# Patient Record
Sex: Female | Born: 1980 | Race: White | Hispanic: No | Smoking: Former smoker
Health system: Southern US, Community
[De-identification: ages and names within clinical notes are randomized; demographics above are authoritative.]

## PROBLEM LIST (undated history)

## (undated) ENCOUNTER — Ambulatory Visit: Admission: EM | Source: Home / Self Care

## (undated) DIAGNOSIS — F419 Anxiety disorder, unspecified: Secondary | ICD-10-CM

## (undated) DIAGNOSIS — Z9889 Other specified postprocedural states: Secondary | ICD-10-CM

## (undated) DIAGNOSIS — K219 Gastro-esophageal reflux disease without esophagitis: Secondary | ICD-10-CM

## (undated) DIAGNOSIS — G510 Bell's palsy: Secondary | ICD-10-CM

## (undated) DIAGNOSIS — Z8489 Family history of other specified conditions: Secondary | ICD-10-CM

## (undated) DIAGNOSIS — F32A Depression, unspecified: Secondary | ICD-10-CM

---
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
## 2001-06-22 HISTORY — PX: TONSILLECTOMY AND ADENOIDECTOMY: SHX28

## 2004-06-22 HISTORY — PX: BUNIONECTOMY: SHX129

## 2006-06-22 DIAGNOSIS — G510 Bell's palsy: Secondary | ICD-10-CM

## 2006-06-22 DIAGNOSIS — E0590 Thyrotoxicosis, unspecified without thyrotoxic crisis or storm: Secondary | ICD-10-CM

## 2006-06-22 HISTORY — PX: DILATION AND CURETTAGE OF UTERUS: SHX78

## 2006-06-22 HISTORY — DX: Thyrotoxicosis, unspecified without thyrotoxic crisis or storm: E05.90

## 2006-06-22 HISTORY — DX: Bell's palsy: G51.0

## 2006-12-05 ENCOUNTER — Emergency Department: Payer: Self-pay | Admitting: Emergency Medicine

## 2006-12-08 ENCOUNTER — Ambulatory Visit: Payer: Self-pay | Admitting: Obstetrics and Gynecology

## 2007-11-04 ENCOUNTER — Emergency Department: Payer: Self-pay | Admitting: Unknown Physician Specialty

## 2007-11-16 ENCOUNTER — Emergency Department: Payer: Self-pay | Admitting: Emergency Medicine

## 2008-06-09 ENCOUNTER — Observation Stay: Payer: Self-pay | Admitting: Obstetrics and Gynecology

## 2008-06-14 ENCOUNTER — Observation Stay: Payer: Self-pay | Admitting: Unknown Physician Specialty

## 2008-06-14 ENCOUNTER — Inpatient Hospital Stay: Payer: Self-pay | Admitting: Unknown Physician Specialty

## 2009-11-20 ENCOUNTER — Emergency Department: Payer: Self-pay | Admitting: Emergency Medicine

## 2010-09-04 ENCOUNTER — Emergency Department: Payer: Self-pay | Admitting: Emergency Medicine

## 2010-10-14 ENCOUNTER — Emergency Department: Payer: Self-pay | Admitting: Emergency Medicine

## 2010-10-28 ENCOUNTER — Emergency Department: Payer: Self-pay | Admitting: Emergency Medicine

## 2011-05-03 ENCOUNTER — Emergency Department: Payer: Self-pay | Admitting: Emergency Medicine

## 2011-10-12 IMAGING — CT CT ABD-PELV W/ CM
1 of 2 series · 15 of 32 positions shown, 19 images · non-contrast
Comparison: none

REASON FOR EXAM: (1) RUQ/RLQ pain nausea; (2) RUQ/RLQ pain nausea
COMMENTS:

[Series 2: 3mm soft tissue · axial · 0.55mm/px · z∈[-482,-74]mm · 15 of 148 slices shown, 19 images]
[im 6/148  soft-tissue]
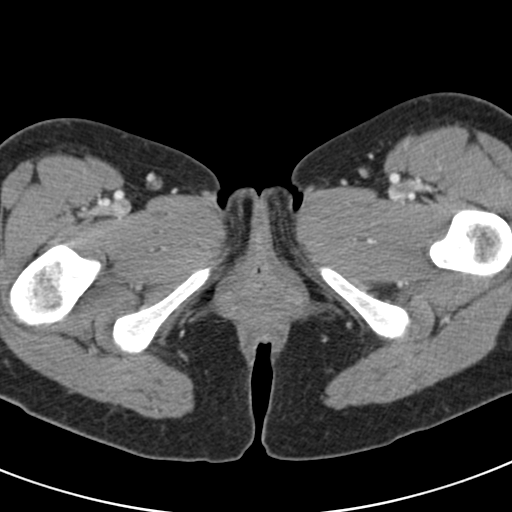
[im 6/148  bone]
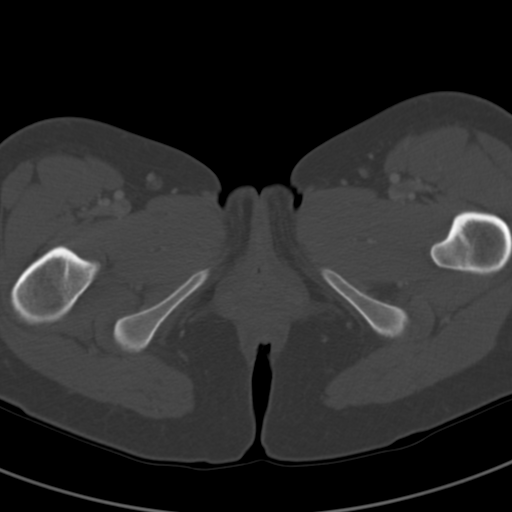
[im 17/148  soft-tissue]
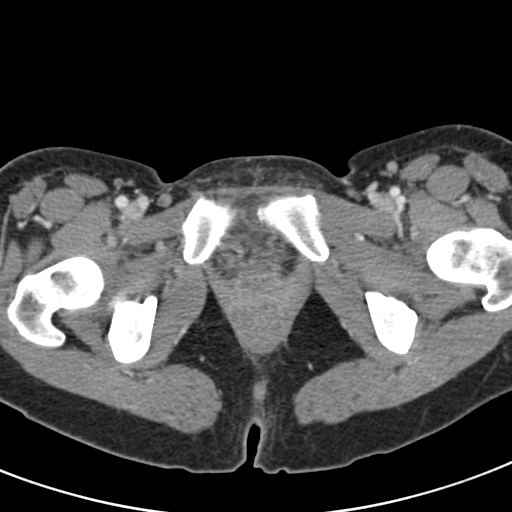
[im 29/148  soft-tissue]
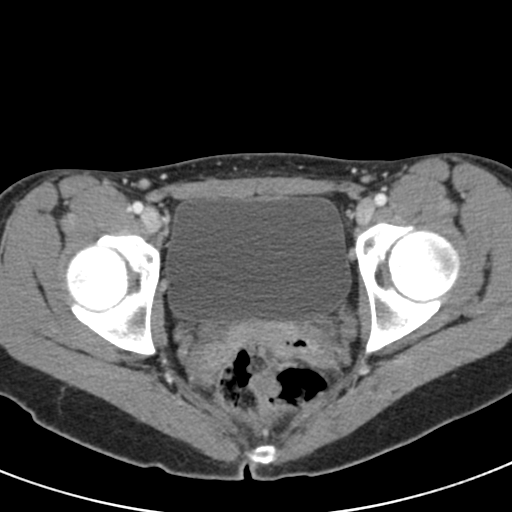
[im 40/148  soft-tissue]
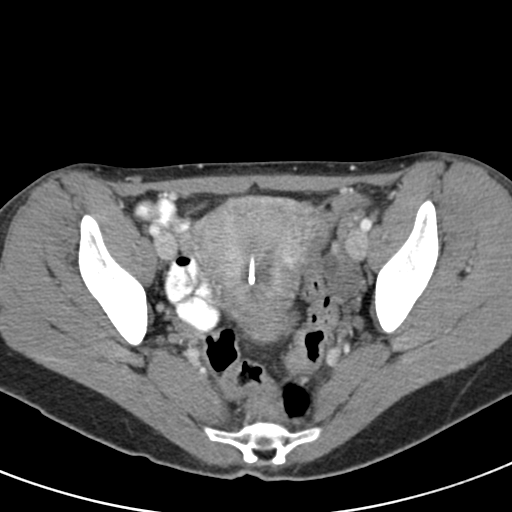
[im 51/148  soft-tissue]
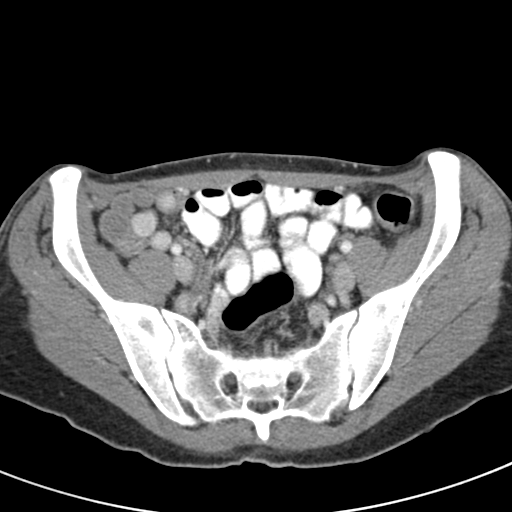
[im 63/148  soft-tissue]
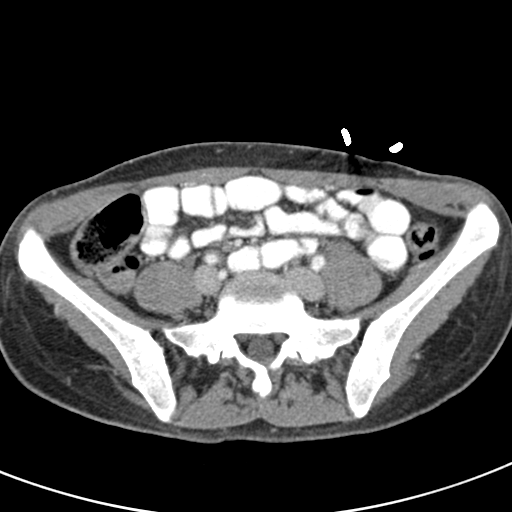
[im 74/148  soft-tissue]
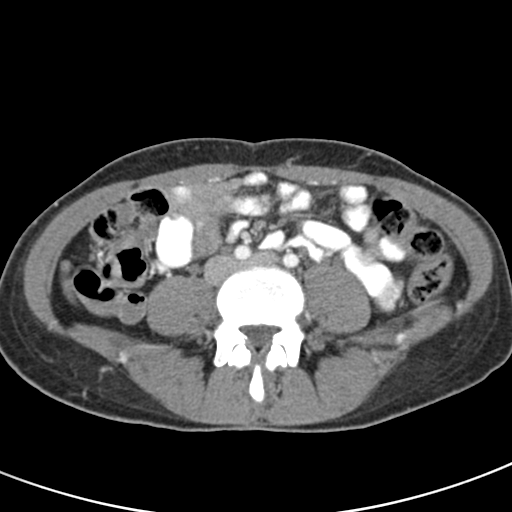
[im 85/148  soft-tissue]
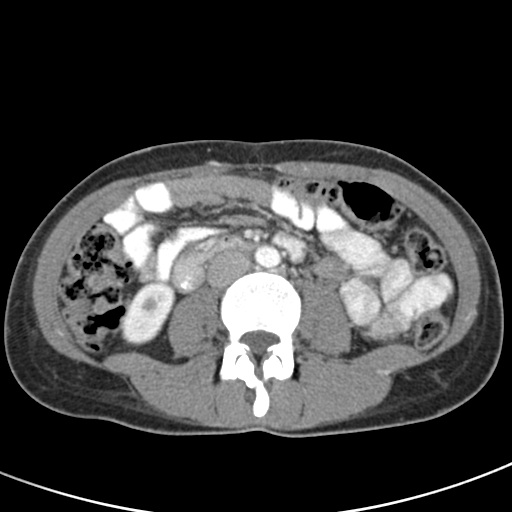
[im 97/148  soft-tissue]
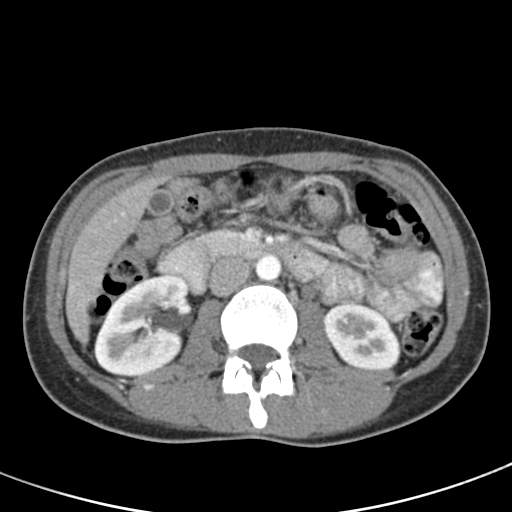
[im 97/148  bone]
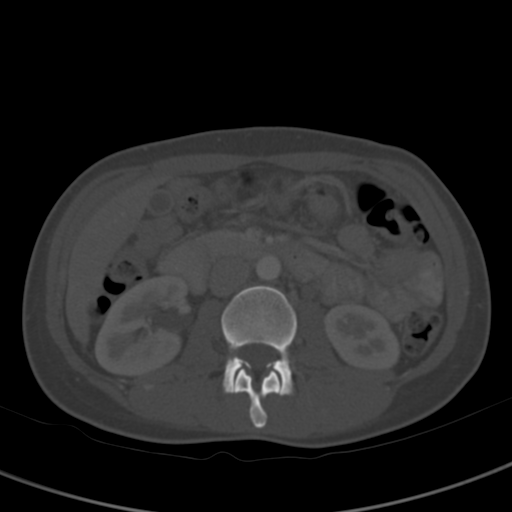
[im 108/148  soft-tissue]
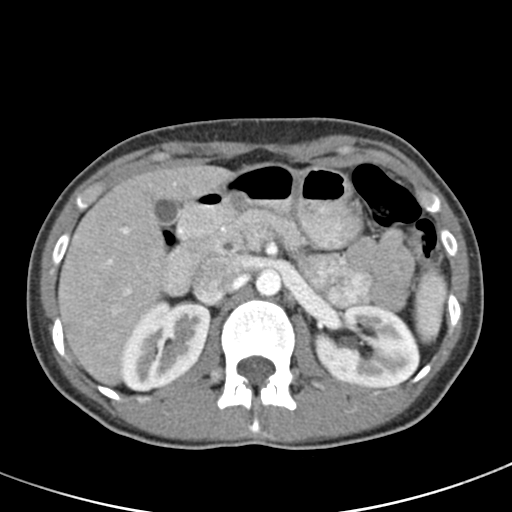
[im 119/148  soft-tissue]
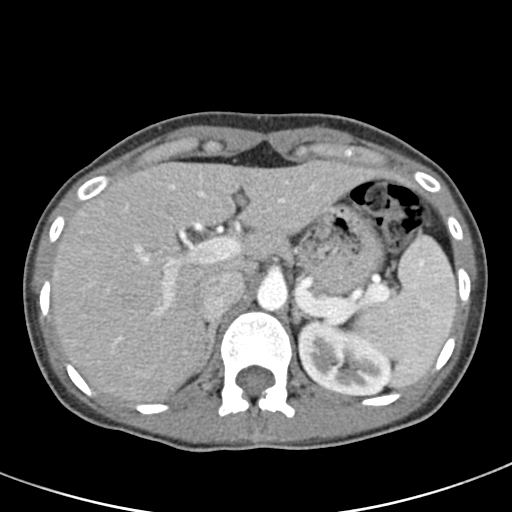
[im 125/148  lung]
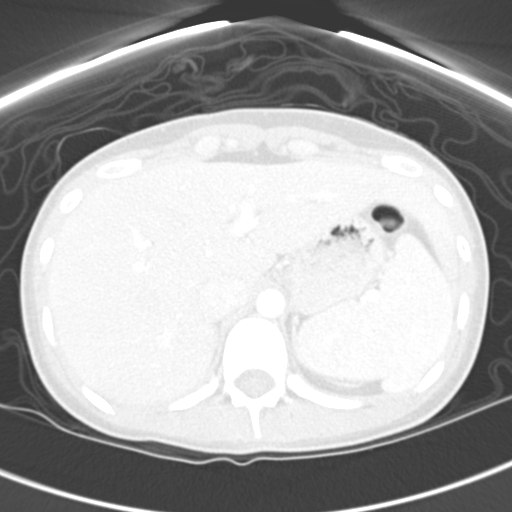
[im 131/148  soft-tissue]
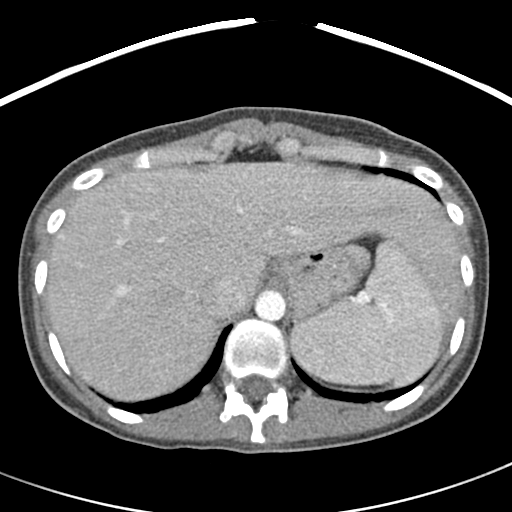
[im 131/148  lung]
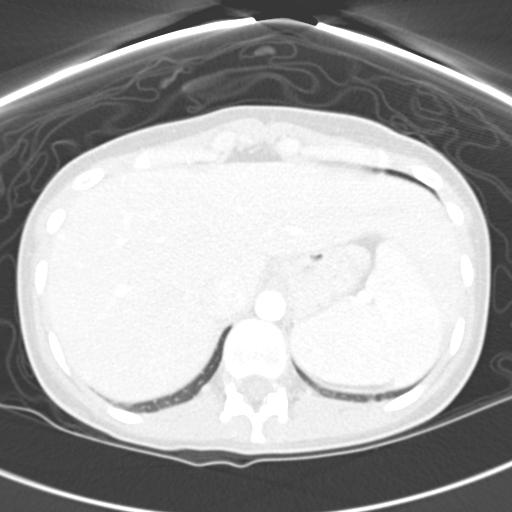
[im 136/148  lung]
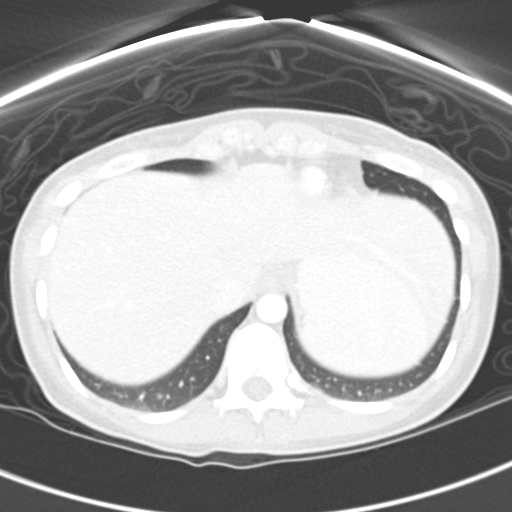
[im 142/148  soft-tissue]
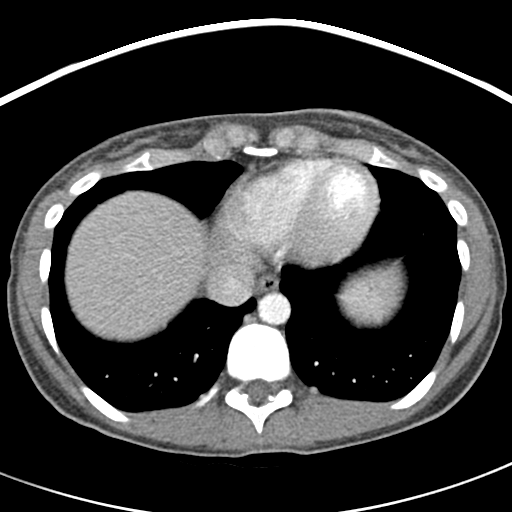
[im 142/148  lung]
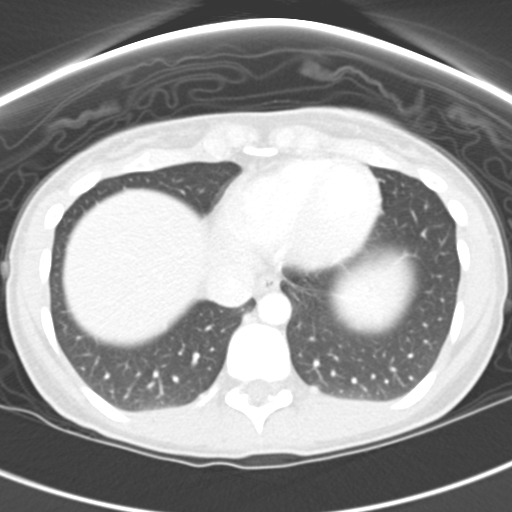

[15 of 32 positions shown; findings below may reference images not displayed]

PROCEDURE:     CT  - CT ABDOMEN / PELVIS  W  - October 15, 2010  [DATE]

RESULT:     Emergent CT of the abdomen and pelvis is performed with 100 mL
of Msovue-DQX iodinated intravenous contrast and oral contrast with images
reconstructed at 3.0 mm slice thickness. There is no previous exam for
comparison.

Images through the base of the lungs demonstrate minimal dependent
atelectasis. The kidneys enhance normally. The liver, spleen, pancreas,
adrenal glands and gallbladder as well as the aorta appear within normal
limits. The appendix is normal. An intrauterine device is present. The
urinary bladder and uterus are otherwise unremarkable. There is no
adenopathy or adnexal mass. There is no ascites or evidence of
pneumoperitoneum. No inflammatory stranding is seen. No abnormal bowel
distention or bowel wall thickening is evident.
IMPRESSION: 1.     Unremarkable CT of the abdomen and pelvis.
2.     Intrauterine device present.

## 2011-12-18 ENCOUNTER — Emergency Department: Payer: Self-pay | Admitting: Emergency Medicine

## 2011-12-18 LAB — URINALYSIS, COMPLETE
Bilirubin,UR: NEGATIVE
Glucose,UR: NEGATIVE mg/dL (ref 0–75)
Ketone: NEGATIVE
Protein: 100
Squamous Epithelial: 15
WBC UR: 1273 /HPF (ref 0–5)

## 2011-12-18 LAB — CBC WITH DIFFERENTIAL/PLATELET
Basophil #: 0.1 10*3/uL (ref 0.0–0.1)
HGB: 13.8 g/dL (ref 12.0–16.0)
Lymphocyte #: 2.7 10*3/uL (ref 1.0–3.6)
Lymphocyte %: 31 %
MCHC: 33.5 g/dL (ref 32.0–36.0)
Monocyte #: 0.6 x10 3/mm (ref 0.2–0.9)
Monocyte %: 6.9 %
Platelet: 196 10*3/uL (ref 150–440)
RBC: 4.58 10*6/uL (ref 3.80–5.20)
WBC: 8.7 10*3/uL (ref 3.6–11.0)

## 2011-12-18 LAB — COMPREHENSIVE METABOLIC PANEL
Albumin: 4.6 g/dL (ref 3.4–5.0)
Alkaline Phosphatase: 78 U/L (ref 50–136)
BUN: 19 mg/dL — ABNORMAL HIGH (ref 7–18)
Creatinine: 0.91 mg/dL (ref 0.60–1.30)
Glucose: 73 mg/dL (ref 65–99)
SGOT(AST): 18 U/L (ref 15–37)
Sodium: 138 mmol/L (ref 136–145)

## 2011-12-18 LAB — PREGNANCY, URINE: Pregnancy Test, Urine: NEGATIVE m[IU]/mL

## 2011-12-20 LAB — URINE CULTURE

## 2014-05-22 ENCOUNTER — Emergency Department: Payer: Self-pay | Admitting: Internal Medicine

## 2014-05-22 LAB — URINALYSIS, COMPLETE
Specific Gravity: 1.01 (ref 1.003–1.030)
Squamous Epithelial: 4

## 2014-05-22 LAB — CBC WITH DIFFERENTIAL/PLATELET
BASOS PCT: 0.4 %
Basophil #: 0 10*3/uL (ref 0.0–0.1)
EOS ABS: 0.2 10*3/uL (ref 0.0–0.7)
Eosinophil %: 1.8 %
HCT: 42.2 % (ref 35.0–47.0)
HGB: 13.9 g/dL (ref 12.0–16.0)
Lymphocyte #: 1.8 10*3/uL (ref 1.0–3.6)
Lymphocyte %: 14.4 %
MCH: 30.1 pg (ref 26.0–34.0)
MCHC: 33 g/dL (ref 32.0–36.0)
MCV: 91 fL (ref 80–100)
MONOS PCT: 6.2 %
Monocyte #: 0.8 x10 3/mm (ref 0.2–0.9)
NEUTROS PCT: 77.2 %
Neutrophil #: 9.7 10*3/uL — ABNORMAL HIGH (ref 1.4–6.5)
Platelet: 242 10*3/uL (ref 150–440)
RBC: 4.63 10*6/uL (ref 3.80–5.20)
RDW: 13 % (ref 11.5–14.5)
WBC: 12.5 10*3/uL — ABNORMAL HIGH (ref 3.6–11.0)

## 2014-05-22 LAB — COMPREHENSIVE METABOLIC PANEL
ALBUMIN: 4.1 g/dL (ref 3.4–5.0)
ALT: 15 U/L
ANION GAP: 5 — AB (ref 7–16)
AST: 21 U/L (ref 15–37)
Alkaline Phosphatase: 58 U/L
BUN: 8 mg/dL (ref 7–18)
Bilirubin,Total: 0.5 mg/dL (ref 0.2–1.0)
CALCIUM: 8.7 mg/dL (ref 8.5–10.1)
CHLORIDE: 102 mmol/L (ref 98–107)
CREATININE: 0.78 mg/dL (ref 0.60–1.30)
Co2: 32 mmol/L (ref 21–32)
EGFR (Non-African Amer.): >60
Glucose: 97 mg/dL (ref 65–99)
Osmolality: 276 (ref 275–301)
POTASSIUM: 3.5 mmol/L (ref 3.5–5.1)
SODIUM: 139 mmol/L (ref 136–145)
Total Protein: 7.6 g/dL (ref 6.4–8.2)

## 2014-05-24 LAB — URINE CULTURE

## 2018-04-05 ENCOUNTER — Other Ambulatory Visit: Payer: Self-pay

## 2018-04-05 ENCOUNTER — Emergency Department
Admission: EM | Admit: 2018-04-05 | Discharge: 2018-04-05 | Disposition: A | Discharge status: Home / Self Care | Payer: Self-pay | Attending: Emergency Medicine | Admitting: Emergency Medicine

## 2018-04-05 ENCOUNTER — Encounter: Payer: Self-pay | Admitting: Emergency Medicine

## 2018-04-05 DIAGNOSIS — R519 Headache, unspecified: Secondary | ICD-10-CM

## 2018-04-05 DIAGNOSIS — K047 Periapical abscess without sinus: Secondary | ICD-10-CM | POA: Insufficient documentation

## 2018-04-05 DIAGNOSIS — R51 Headache: Secondary | ICD-10-CM | POA: Insufficient documentation

## 2018-04-05 HISTORY — DX: Bell's palsy: G51.0

## 2018-04-05 LAB — CBC
HCT: 43.5 % (ref 36.0–46.0)
HEMOGLOBIN: 14.4 g/dL (ref 12.0–15.0)
MCH: 31.4 pg (ref 26.0–34.0)
MCHC: 33.1 g/dL (ref 30.0–36.0)
MCV: 95 fL (ref 80.0–100.0)
Platelets: 266 10*3/uL (ref 150–400)
RBC: 4.58 MIL/uL (ref 3.87–5.11)
RDW: 12.1 % (ref 11.5–15.5)
WBC: 8.1 10*3/uL (ref 4.0–10.5)
nRBC: 0 % (ref 0.0–0.2)

## 2018-04-05 LAB — BASIC METABOLIC PANEL
Anion gap: 10 (ref 5–15)
BUN: 17 mg/dL (ref 6–20)
CO2: 28 mmol/L (ref 22–32)
Calcium: 9.1 mg/dL (ref 8.9–10.3)
Chloride: 102 mmol/L (ref 98–111)
Creatinine, Ser: 0.71 mg/dL (ref 0.44–1.00)
GFR calc Af Amer: >60 mL/min (ref >60)
GLUCOSE: 102 mg/dL — AB (ref 70–99)
POTASSIUM: 3.2 mmol/L — AB (ref 3.5–5.1)
SODIUM: 140 mmol/L (ref 135–145)

## 2018-04-05 MED ORDER — PENICILLIN V POTASSIUM 500 MG PO TABS: 500.0000 mg | ORAL_TABLET | Freq: Four times a day (QID) | ORAL | 0 refills | Status: AC

## 2018-04-05 MED ORDER — PENICILLIN V POTASSIUM 250 MG PO TABS
500.0000 mg | ORAL_TABLET | Freq: Once | ORAL | Status: AC
  Administered 2018-04-05: 500 mg via ORAL
  Filled 2018-04-05: qty 2

## 2018-04-05 MED ORDER — ONDANSETRON 4 MG PO TBDP: 4.0000 mg | ORAL_TABLET | Freq: Three times a day (TID) | ORAL | 0 refills | Status: DC | PRN

## 2018-04-05 MED ORDER — KETOROLAC TROMETHAMINE 10 MG PO TABS
10.0000 mg | ORAL_TABLET | Freq: Once | ORAL | Status: AC
  Administered 2018-04-05: 10 mg via ORAL
  Filled 2018-04-05: qty 1

## 2018-04-05 NOTE — ED Triage Notes (Addendum)
Pt to ED via POV with c/o RT sided facial numbness sicne 0430 this am. PT c/o tenderness to side as well. VSS, speech clear. Hx of bell palsy

## 2018-04-05 NOTE — ED Provider Notes (Signed)
[facility] Emergency Department Provider Note  ____________________________________________  Time seen: Approximately 7:24 PM  I have reviewed the triage vital signs and the nursing notes.   HISTORY  Chief Complaint Numbness    HPI Sarah Summers is a 37 y.o. female with a history of left-sided Bell's palsy remotely who complains of right facial pain and swelling since this morning.  Gradual onset, worsening.  Denies fever chills vision change headache neck stiffness or vomiting.  No recent trauma.  No trouble breathing or swallowing.  3 days ago she chipped a tooth while eating chips during the night out.      Past Medical History:  Diagnosis Date  . Bell palsy      There are no active problems to display for this patient.    History reviewed. No pertinent surgical history.   Prior to Admission medications   Medication Sig Start Date End Date Taking? Authorizing Provider  ondansetron (ZOFRAN ODT) 4 MG disintegrating tablet Take 1 tablet (4 mg total) by mouth every 8 (eight) hours as needed for nausea or vomiting. 04/05/18   Sharman Cheek, MD  penicillin v potassium (VEETID) 500 MG tablet Take 1 tablet (500 mg total) by mouth 4 (four) times daily for 7 days. 04/05/18 04/12/18  Sharman Cheek, MD     Allergies Patient has no known allergies.   No family history on file.  Social History Social History   Tobacco Use  . Smoking status: Never Smoker  . Smokeless tobacco: Never Used  Substance Use Topics  . Alcohol use: Not Currently  . Drug use: Not Currently    Review of Systems  Constitutional:   No fever or chills.  ENT:   No sore throat. No rhinorrhea.  Right facial pain as above Cardiovascular:   No chest pain or syncope. Respiratory:   No dyspnea or cough. Gastrointestinal:   Negative for abdominal pain, vomiting and diarrhea.  Musculoskeletal:   Negative for focal pain or swelling All other systems reviewed and are  negative except as documented above in ROS and HPI.  ____________________________________________   PHYSICAL EXAM:  VITAL SIGNS: ED Triage Vitals  Enc Vitals Group     BP 04/05/18 1418 131/81     Pulse Rate 04/05/18 1418 90     Resp 04/05/18 1418 16     Temp 04/05/18 1418 98.4 F (36.9 C)     Temp Source 04/05/18 1418 Oral     SpO2 04/05/18 1418 99 %     Weight 04/05/18 1419 125 lb (56.7 kg)     Height 04/05/18 1419 5\' 4"  (1.626 m)     Head Circumference --      Peak Flow --      Pain Score 04/05/18 1419 6     Pain Loc --      Pain Edu? --      Excl. in GC? --     Vital signs reviewed, nursing assessments reviewed.   Constitutional:   Alert and oriented. Non-toxic appearance. Eyes:   Conjunctivae are normal. EOMI. PERRL. ENT      Head:   Normocephalic and atraumatic.  Slight swelling of the right maxillary face without erythema induration streaking or fluctuance.  Not significantly tender.      Nose:   No congestion/rhinnorhea.  Normal      Mouth/Throat:   MMM, poor dentition with multiple decayed teeth.  Right upper premolar is decayed and fractured with a small amount of purulent discharge from the  tooth stump.  No gingival swelling.  Uvula midline.  Floor of mouth soft.  No pharyngeal erythema. No peritonsillar mass.       Neck:   No meningismus. Full ROM. Hematological/Lymphatic/Immunilogical:   No cervical or occipital or periauricular lymphadenopathy. Cardiovascular:   RRR. Symmetric bilateral radial and DP pulses.  No murmurs. Cap refill less than 2 seconds. Respiratory:   Normal respiratory effort without tachypnea/retractions. Breath sounds are clear and equal bilaterally. No wheezes/rales/rhonchi. Gastrointestinal:   Soft and nontender. Non distended. There is no CVA tenderness.  No rebound, rigidity, or guarding. Musculoskeletal:   Normal range of motion in all extremities. Neurologic:   Normal speech and language.  Motor grossly intact. No acute focal  neurologic deficits are appreciated.   ____________________________________________    LABS (pertinent positives/negatives) (all labs ordered are listed, but only abnormal results are displayed) Labs Reviewed  BASIC METABOLIC PANEL - Abnormal; Notable for the following components:      Result Value   Potassium 3.2 (*)    Glucose, Bld 102 (*)    All other components within normal limits  CBC  URINALYSIS, COMPLETE (UACMP) WITH MICROSCOPIC  CBG MONITORING, ED  POC URINE PREG, ED   ____________________________________________   EKG    ____________________________________________    RADIOLOGY  No results found.  ____________________________________________   PROCEDURES Procedures  ____________________________________________    CLINICAL IMPRESSION / ASSESSMENT AND PLAN / ED COURSE  Pertinent labs & imaging results that were available during my care of the patient were reviewed by me and considered in my medical decision making (see chart for details).    Patient presents with odontogenic abscess in the right upper jaw with some evidence of secondary space infection although vital signs are normal, there is no frank erythema or cellulitis of the face.  Appears to be limited to the tooth without gingival abscess.  I will start her on penicillin, Zofran, NSAIDs.  Follow-up with dentistry within 1 week.      ____________________________________________   FINAL CLINICAL IMPRESSION(S) / ED DIAGNOSES    Final diagnoses:  Abscessed tooth  Right facial pain     ED Discharge Orders         Ordered    ondansetron (ZOFRAN ODT) 4 MG disintegrating tablet  Every 8 hours PRN     04/05/18 1923    penicillin v potassium (VEETID) 500 MG tablet  4 times daily     04/05/18 1923          Portions of this note were generated with dragon dictation software. Dictation errors may occur despite best attempts at proofreading.    Sharman Cheek, MD 04/05/18 [phone]

## 2018-04-05 NOTE — Discharge Instructions (Addendum)
Take ibuprofen 600mg  and tylenol 650mg  every 4-6 hours for pain control. Follow up with dentistry in 3-7 days.

## 2019-04-14 ENCOUNTER — Other Ambulatory Visit: Payer: Self-pay

## 2019-04-14 DIAGNOSIS — Z20822 Contact with and (suspected) exposure to covid-19: Secondary | ICD-10-CM

## 2019-04-16 LAB — NOVEL CORONAVIRUS, NAA: SARS-CoV-2, NAA: NOT DETECTED

## 2021-08-06 ENCOUNTER — Other Ambulatory Visit: Payer: Self-pay

## 2021-08-06 ENCOUNTER — Encounter
Admission: RE | Admit: 2021-08-06 | Discharge: 2021-08-06 | Disposition: A | Discharge status: Home / Self Care | Payer: Medicaid Other | Source: Ambulatory Visit | Attending: Obstetrics and Gynecology | Admitting: Obstetrics and Gynecology

## 2021-08-06 VITALS — Ht 67.0 in | Wt 137.0 lb

## 2021-08-06 DIAGNOSIS — Z01812 Encounter for preprocedural laboratory examination: Secondary | ICD-10-CM

## 2021-08-06 HISTORY — DX: Anxiety disorder, unspecified: F41.9

## 2021-08-06 HISTORY — DX: Family history of other specified conditions: Z84.89

## 2021-08-06 HISTORY — DX: Depression, unspecified: F32.A

## 2021-08-06 HISTORY — DX: Gastro-esophageal reflux disease without esophagitis: K21.9

## 2021-08-06 MED ORDER — LACTATED RINGERS IV SOLN: INTRAVENOUS | Status: DC

## 2021-08-06 MED ORDER — CHLORHEXIDINE GLUCONATE 0.12 % MT SOLN: 15.0000 mL | Freq: Once | OROMUCOSAL | Status: AC

## 2021-08-06 MED ORDER — POVIDONE-IODINE 10 % EX SWAB: 2.0000 "application " | Freq: Once | CUTANEOUS | Status: DC

## 2021-08-06 MED ORDER — GABAPENTIN 300 MG PO CAPS: 300.0000 mg | ORAL_CAPSULE | ORAL | Status: AC

## 2021-08-06 MED ORDER — DOXYCYCLINE HYCLATE 100 MG IV SOLR
200.0000 mg | INTRAVENOUS | Status: AC
  Administered 2021-08-07: 200 mg via INTRAVENOUS
  Filled 2021-08-06: qty 200

## 2021-08-06 MED ORDER — ACETAMINOPHEN 500 MG PO TABS: 1000.0000 mg | ORAL_TABLET | ORAL | Status: AC

## 2021-08-06 MED ORDER — ORAL CARE MOUTH RINSE
15.0000 mL | Freq: Once | OROMUCOSAL | Status: AC
Start: 2021-08-06 — End: 2021-08-07

## 2021-08-06 NOTE — H&P (Signed)
Scheduled for Suction D+C Sarah Summers is a 41 y.o. female here for follow up sono .G4P2 . Had a prior D+C 12+[redacted] weeks EGA  Pt here for u/s for Threatened AB .  BT A+  U/s  Done :  Single nonviable gestation     Crl= 4.74 cm= 11.4 wks   Fetal  Abnormal  Abd seen    Probable omphalocele    No fetal heart motion seen     Lt ov not seen Rt ov simple cyst=2.16 cm     Cx lg=5.57 cm   Pt states she is interested in permanent sterilization ( medicaid )    Past Medical History:  has a past medical history of Anxiety (2001), Depression, GERD (gastroesophageal reflux disease), and History of abnormal cervical Pap smear (2001).  Past Surgical History:  has a past surgical history that includes Fracture surgery and Tonsillectomy. Family History: family history includes Breast cancer in her maternal grandmother; Colon cancer in her paternal grandmother; Mental illness in her paternal grandmother. Social History:  reports that she has been smoking cigarettes. She started smoking about 10 years ago. She has a 27.00 pack-year smoking history. She has never used smokeless tobacco. She reports that she does not drink alcohol and does not use drugs. OB/GYN History:  OB History     Gravida  4   Para  2   Term  2   Preterm      AB  1   Living  2      SAB  1   IAB      Ectopic      Molar      Multiple      Live Births  2          Allergies: has No Known Allergies. Medications:  Current Outpatient Medications:    pediatric multivitamin no.76 (FLINTSTONES COMPLETE) Chew, Take 2 tablets by mouth, Disp: , Rfl:    sertraline (ZOLOFT) 100 MG tablet, Take 0.5 tablets (50 mg total) by mouth once daily TAKE ONE-HALF TABLET BY MOUTH DAILY, Disp: 45 tablet, Rfl: 0   albuterol 90 mcg/actuation inhaler, Inhale 2 inhalations into the lungs every 6 (six) hours as needed for Wheezing for up to 30 days (Patient not taking: Reported on 06/30/2021), Disp: 1 Inhaler, Rfl: 0    cholecalciferol (VITAMIN D3) 2,000 unit tablet, Take 2 tablets (4,000 Units total) by mouth once daily (Patient not taking: Reported on 06/30/2021), Disp: 30 tablet, Rfl: 11   DM/p-ephed/acetaminoph/doxylam (VICKS NYQUIL LIQUICAPS ORAL), Take by mouth (Patient not taking: Reported on 08/06/2021), Disp: , Rfl:    promethazine (PHENERGAN) 25 MG tablet, Take 1 tablet (25 mg total) by mouth every 6 (six) hours as needed for Nausea (Patient not taking: Reported on 08/06/2021), Disp: 30 tablet, Rfl: 1   traZODone (DESYREL) 50 MG tablet, ANYWHERE FROM 1/2 TAB TO 3 WHOLE TABS AT BEDTIME FOR SLEEP. (Patient not taking: Reported on 08/06/2021), Disp: 90 tablet, Rfl: 3  Review of Systems: General:   No fatigue or weight loss Eyes:   No vision changes Ears:   No hearing difficulty Respiratory:                No cough or shortness of breath Pulmonary:   No asthma or shortness of breath Cardiovascular:        No chest pain, palpitations, dyspnea on exertion Gastrointestinal:          No abdominal bloating, chronic diarrhea, constipations, masses, pain or hematochezia  Genitourinary:  No hematuria, dysuria, abnormal vaginal discharge, pelvic pain, Menometrorrhagia Lymphatic:  No swollen lymph nodes Musculoskeletal: No muscle weakness Neurologic:  No extremity weakness, syncope, seizure disorder Psychiatric:  No history of depression, delusions or suicidal/homicidal ideation    Exam:   Vitals:   08/06/21 1154  BP: 114/77  Pulse: 90    Body mass index is 21.46 kg/m.  WDWN white/ female in NAD   Lungs: CTA  CV : RRR without murmur    Neck:  no thyromegaly Abdomen: soft , no mass, normal active bowel sounds,  non-tender, no rebound tenderness Pelvic: tanner stage 5 ,  External genitalia: vulva /labia no lesions Urethra: no prolapse Vagina: normal physiologic d/c Cervix: no lesions, no cervical motion tenderness , closed   Uterus: normal size shape and contour, non-tender Adnexa: no mass,   non-tender   Rectovaginal:  Impression:   The primary encounter diagnosis was Missed ab. A diagnosis of Fetal omphalocele in pregnancy, antepartum, fetus 1 was also pertinent to this visit.  Given pt does not want additional children I will not do genetic testing on the products of conception   Plan:   Offered cytotec vaginal ( Home tx ) vs Suction D+C ( my recommendation )  Scheduled for D+C in am .  Benefits and risks to surgery: The proposed benefit of the surgery has been discussed with the patient. The possible risks include, but are not limited to: organ injury to the bowel , bladder, ureters, and major blood vessels and nerves. There is a possibility of additional surgeries resulting from these injuries. There is also the risk of blood transfusion and the need to receive blood products during or after the procedure which may rarely lead to HIV or Hepatitis C infection. There is a risk of developing a deep venous thrombosis or a pulmonary embolism . There is the possibility of wound infection and also anesthetic complications, even the rare possibility of death. The patient understands these risks and wishes to proceed. All questions have been answered and the consent has been signed.  H+P completed today  65 minutes in pt care    No follow-ups on file.  Caroline Sauger, MD

## 2021-08-06 NOTE — Patient Instructions (Addendum)
Your procedure is scheduled on: Thursday, February 16 Report to the Registration Desk on the 1st floor of the [facility] 7:30 am To find out your arrival time, please call [phone] between 1PM - 3PM on: Wednesday, February 15  REMEMBER: Instructions that are not followed completely may result in serious medical risk, up to and including death; or upon the discretion of your surgeon and anesthesiologist your surgery may need to be rescheduled.  Do not eat food after midnight the night before surgery.  No gum chewing, lozengers or hard candies.  You may however, drink CLEAR liquids up to 2 hours before you are scheduled to arrive for your surgery. Do not drink anything within 2 hours of your scheduled arrival time.  Clear liquids include: - water  - apple juice without pulp - gatorade (not RED, PURPLE, OR BLUE) - black coffee or tea (Do NOT add milk or creamers to the coffee or tea) Do NOT drink anything that is not on this list.  TAKE THESE MEDICATIONS THE MORNING OF SURGERY WITH A SIP OF WATER:  Omeprazole (Prilosec) - (take one the night before and one on the morning of surgery - helps to prevent nausea after surgery.) Sertraline (Zoloft)  One week prior to surgery: Stop Anti-inflammatories (NSAIDS) such as Advil, Aleve, Ibuprofen, Motrin, Naproxen, Naprosyn and Aspirin based products such as Excedrin, Goodys Powder, BC Powder. Stop ANY OVER THE COUNTER supplements until after surgery. You may however, continue to take Tylenol if needed for pain up until the day of surgery.  No Alcohol for 24 hours before or after surgery.  No Smoking including e-cigarettes for 24 hours prior to surgery.  No chewable tobacco products for at least 6 hours prior to surgery.  No nicotine patches on the day of surgery.  Do not use any "recreational" drugs for at least a week prior to your surgery.  Please be advised that the combination of cocaine and anesthesia may have negative outcomes,  up to and including death. If you test positive for cocaine, your surgery will be cancelled.  On the morning of surgery brush your teeth with toothpaste and water, you may rinse your mouth with mouthwash if you wish. Do not swallow any toothpaste or mouthwash.  Do not wear jewelry, make-up, hairpins, clips or nail polish.  Do not wear lotions, powders, or perfumes.   Do not shave body from the neck down 48 hours prior to surgery just in case you cut yourself which could leave a site for infection.   Contact lenses, hearing aids and dentures may not be worn into surgery.  Do not bring valuables to the hospital. [facility] is not responsible for any missing/lost belongings or valuables.   Notify your doctor if there is any change in your medical condition (cold, fever, infection).  Wear comfortable clothing (specific to your surgery type) to the hospital.  After surgery, you can help prevent lung complications by doing breathing exercises.  Take deep breaths and cough every 1-2 hours. Your doctor may order a device called an Incentive Spirometer to help you take deep breaths.  If you are being discharged the day of surgery, you will not be allowed to drive home. You will need a responsible adult (18 years or older) to drive you home and stay with you that night.   If you are taking public transportation, you will need to have a responsible adult (18 years or older) with you. Please confirm with your physician that it is  acceptable to use public transportation.   Please call the Pre-admissions Testing Dept. at [phone] if you have any questions about these instructions.  Surgery Visitation Policy:  Patients undergoing a surgery or procedure may have one family member or support person with them as long as that person is not COVID-19 positive or experiencing its symptoms.  That person may remain in the waiting area during the procedure and may rotate out with other people.

## 2021-08-07 ENCOUNTER — Ambulatory Visit: Payer: Medicaid Other | Admitting: Anesthesiology

## 2021-08-07 ENCOUNTER — Other Ambulatory Visit: Payer: Self-pay

## 2021-08-07 ENCOUNTER — Ambulatory Visit
Admission: RE | Admit: 2021-08-07 | Discharge: 2021-08-07 | Disposition: A | Discharge status: Home / Self Care | Payer: Medicaid Other | Attending: Obstetrics and Gynecology | Admitting: Obstetrics and Gynecology

## 2021-08-07 ENCOUNTER — Encounter: Payer: Self-pay | Admitting: Obstetrics and Gynecology

## 2021-08-07 ENCOUNTER — Encounter
Admission: RE | Disposition: A | Discharge status: Home / Self Care | Payer: Self-pay | Source: Home / Self Care | Attending: Obstetrics and Gynecology

## 2021-08-07 DIAGNOSIS — F32A Depression, unspecified: Secondary | ICD-10-CM | POA: Insufficient documentation

## 2021-08-07 DIAGNOSIS — O35FXX0 Maternal care for other (suspected) fetal abnormality and damage, fetal musculoskeletal anomalies of trunk, not applicable or unspecified: Secondary | ICD-10-CM | POA: Insufficient documentation

## 2021-08-07 DIAGNOSIS — F419 Anxiety disorder, unspecified: Secondary | ICD-10-CM | POA: Insufficient documentation

## 2021-08-07 DIAGNOSIS — Z01818 Encounter for other preprocedural examination: Secondary | ICD-10-CM

## 2021-08-07 DIAGNOSIS — O021 Missed abortion: Secondary | ICD-10-CM | POA: Diagnosis present

## 2021-08-07 HISTORY — PX: DILATION AND EVACUATION: SHX1459

## 2021-08-07 LAB — BASIC METABOLIC PANEL
Anion gap: 6 (ref 5–15)
BUN: 9 mg/dL (ref 6–20)
CO2: 26 mmol/L (ref 22–32)
Calcium: 9 mg/dL (ref 8.9–10.3)
Chloride: 103 mmol/L (ref 98–111)
Creatinine, Ser: 0.45 mg/dL (ref 0.44–1.00)
GFR, Estimated: >60 mL/min (ref >60)
Glucose, Bld: 106 mg/dL — ABNORMAL HIGH (ref 70–99)
Potassium: 3.8 mmol/L (ref 3.5–5.1)
Sodium: 135 mmol/L (ref 135–145)

## 2021-08-07 LAB — CBC
HCT: 35.4 % — ABNORMAL LOW (ref 36.0–46.0)
Hemoglobin: 12.3 g/dL (ref 12.0–15.0)
MCH: 30.7 pg (ref 26.0–34.0)
MCHC: 34.7 g/dL (ref 30.0–36.0)
MCV: 88.3 fL (ref 80.0–100.0)
Platelets: 202 10*3/uL (ref 150–400)
RBC: 4.01 MIL/uL (ref 3.87–5.11)
RDW: 12.4 % (ref 11.5–15.5)
WBC: 5.5 10*3/uL (ref 4.0–10.5)
nRBC: 0 % (ref 0.0–0.2)

## 2021-08-07 LAB — TYPE AND SCREEN
ABO/RH(D): A POS
Antibody Screen: NEGATIVE

## 2021-08-07 LAB — ABO/RH: ABO/RH(D): A POS

## 2021-08-07 SURGERY — DILATION AND EVACUATION, UTERUS
Anesthesia: General | Site: Uterus

## 2021-08-07 MED ORDER — SILVER NITRATE-POT NITRATE 75-25 % EX MISC
CUTANEOUS | Status: DC | PRN
  Administered 2021-08-07: 1

## 2021-08-07 MED ORDER — FENTANYL CITRATE (PF) 100 MCG/2ML IJ SOLN
25.0000 ug | INTRAMUSCULAR | Status: DC | PRN
  Administered 2021-08-07: 25 ug via INTRAVENOUS

## 2021-08-07 MED ORDER — DEXAMETHASONE SODIUM PHOSPHATE 10 MG/ML IJ SOLN
INTRAMUSCULAR | Status: DC | PRN
  Administered 2021-08-07: 10 mg via INTRAVENOUS

## 2021-08-07 MED ORDER — METHYLERGONOVINE MALEATE 0.2 MG/ML IJ SOLN
INTRAMUSCULAR | Status: AC
  Filled 2021-08-07: qty 1

## 2021-08-07 MED ORDER — KETOROLAC TROMETHAMINE 30 MG/ML IJ SOLN
INTRAMUSCULAR | Status: DC | PRN
  Administered 2021-08-07: 30 mg via INTRAVENOUS

## 2021-08-07 MED ORDER — LIDOCAINE HCL (CARDIAC) PF 100 MG/5ML IV SOSY
PREFILLED_SYRINGE | INTRAVENOUS | Status: DC | PRN
Start: 2021-08-07 — End: 2021-08-07
  Administered 2021-08-07: 50 mg via INTRAVENOUS

## 2021-08-07 MED ORDER — DEXAMETHASONE SODIUM PHOSPHATE 10 MG/ML IJ SOLN
INTRAMUSCULAR | Status: AC
  Filled 2021-08-07: qty 1

## 2021-08-07 MED ORDER — DEXMEDETOMIDINE (PRECEDEX) IN NS 20 MCG/5ML (4 MCG/ML) IV SYRINGE
PREFILLED_SYRINGE | INTRAVENOUS | Status: AC
  Filled 2021-08-07: qty 5

## 2021-08-07 MED ORDER — PROPOFOL 500 MG/50ML IV EMUL
INTRAVENOUS | Status: AC
  Filled 2021-08-07: qty 50

## 2021-08-07 MED ORDER — FENTANYL CITRATE (PF) 100 MCG/2ML IJ SOLN
INTRAMUSCULAR | Status: AC
  Administered 2021-08-07: 25 ug via INTRAVENOUS
  Filled 2021-08-07: qty 2

## 2021-08-07 MED ORDER — LIDOCAINE HCL (PF) 2 % IJ SOLN
INTRAMUSCULAR | Status: AC
  Filled 2021-08-07: qty 5

## 2021-08-07 MED ORDER — ACETAMINOPHEN 500 MG PO TABS
ORAL_TABLET | ORAL | Status: AC
  Administered 2021-08-07: 1000 mg via ORAL
  Filled 2021-08-07: qty 2

## 2021-08-07 MED ORDER — SILVER NITRATE-POT NITRATE 75-25 % EX MISC
CUTANEOUS | Status: AC
  Filled 2021-08-07: qty 10

## 2021-08-07 MED ORDER — ONDANSETRON HCL 4 MG/2ML IJ SOLN
INTRAMUSCULAR | Status: AC
  Filled 2021-08-07: qty 2

## 2021-08-07 MED ORDER — PROPOFOL 10 MG/ML IV BOLUS
INTRAVENOUS | Status: DC | PRN
  Administered 2021-08-07: 150 mg via INTRAVENOUS

## 2021-08-07 MED ORDER — GABAPENTIN 300 MG PO CAPS
ORAL_CAPSULE | ORAL | Status: AC
  Administered 2021-08-07: 300 mg via ORAL
  Filled 2021-08-07: qty 1

## 2021-08-07 MED ORDER — EPHEDRINE SULFATE (PRESSORS) 50 MG/ML IJ SOLN
INTRAMUSCULAR | Status: DC | PRN
  Administered 2021-08-07: 5 mg via INTRAVENOUS

## 2021-08-07 MED ORDER — FENTANYL CITRATE (PF) 100 MCG/2ML IJ SOLN
INTRAMUSCULAR | Status: DC | PRN
  Administered 2021-08-07: 25 ug via INTRAVENOUS
  Administered 2021-08-07: 50 ug via INTRAVENOUS
  Administered 2021-08-07: 25 ug via INTRAVENOUS

## 2021-08-07 MED ORDER — FENTANYL CITRATE (PF) 100 MCG/2ML IJ SOLN
INTRAMUSCULAR | Status: AC
  Filled 2021-08-07: qty 2

## 2021-08-07 MED ORDER — MIDAZOLAM HCL 2 MG/2ML IJ SOLN
INTRAMUSCULAR | Status: DC | PRN
Start: 2021-08-07 — End: 2021-08-07
  Administered 2021-08-07: 2 mg via INTRAVENOUS

## 2021-08-07 MED ORDER — ONDANSETRON HCL 4 MG/2ML IJ SOLN: 4.0000 mg | Freq: Once | INTRAMUSCULAR | Status: DC | PRN

## 2021-08-07 MED ORDER — ONDANSETRON HCL 4 MG/2ML IJ SOLN
INTRAMUSCULAR | Status: DC | PRN
  Administered 2021-08-07: 4 mg via INTRAVENOUS

## 2021-08-07 MED ORDER — DEXMEDETOMIDINE (PRECEDEX) IN NS 20 MCG/5ML (4 MCG/ML) IV SYRINGE
PREFILLED_SYRINGE | INTRAVENOUS | Status: DC | PRN
  Administered 2021-08-07 (×2): 8 ug via INTRAVENOUS
  Administered 2021-08-07: 4 ug via INTRAVENOUS

## 2021-08-07 MED ORDER — CHLORHEXIDINE GLUCONATE 0.12 % MT SOLN
OROMUCOSAL | Status: AC
  Administered 2021-08-07: 15 mL via OROMUCOSAL
  Filled 2021-08-07: qty 15

## 2021-08-07 MED ORDER — EPHEDRINE 5 MG/ML INJ
INTRAVENOUS | Status: AC
  Filled 2021-08-07: qty 5

## 2021-08-07 MED ORDER — KETOROLAC TROMETHAMINE 30 MG/ML IJ SOLN
INTRAMUSCULAR | Status: AC
  Filled 2021-08-07: qty 1

## 2021-08-07 MED ORDER — 0.9 % SODIUM CHLORIDE (POUR BTL) OPTIME
TOPICAL | Status: DC | PRN
  Administered 2021-08-07: 50 mL

## 2021-08-07 MED ORDER — MIDAZOLAM HCL 2 MG/2ML IJ SOLN
INTRAMUSCULAR | Status: AC
  Filled 2021-08-07: qty 2

## 2021-08-07 SURGICAL SUPPLY — 31 items
BACTOSHIELD CHG 4% 4OZ (MISCELLANEOUS) ×1
DRSG TELFA 3X8 NADH (GAUZE/BANDAGES/DRESSINGS) IMPLANT
FILTER UTR ASPR ASSEMBLY (MISCELLANEOUS) ×2 IMPLANT
FILTER UTR ASPR SPEC (MISCELLANEOUS) ×1 IMPLANT
FLTR UTR ASPR SPEC (MISCELLANEOUS) ×2
GAUZE 4X4 16PLY ~~LOC~~+RFID DBL (SPONGE) ×2 IMPLANT
GLOVE SURG SYN 8.0 (GLOVE) ×2 IMPLANT
GLOVE SURG SYN 8.0 PF PI (GLOVE) ×1 IMPLANT
GOWN STRL REUS W/ TWL LRG LVL3 (GOWN DISPOSABLE) ×1 IMPLANT
GOWN STRL REUS W/ TWL XL LVL3 (GOWN DISPOSABLE) ×1 IMPLANT
GOWN STRL REUS W/TWL LRG LVL3 (GOWN DISPOSABLE) ×2
GOWN STRL REUS W/TWL XL LVL3 (GOWN DISPOSABLE) ×2
KIT BERKELEY 1ST TRIMESTER 3/8 (MISCELLANEOUS) ×2 IMPLANT
KIT TURNOVER CYSTO (KITS) ×2 IMPLANT
MANIFOLD NEPTUNE II (INSTRUMENTS) ×2 IMPLANT
NS IRRIG 500ML POUR BTL (IV SOLUTION) ×1 IMPLANT
PACK DNC HYST (MISCELLANEOUS) ×2 IMPLANT
PAD DRESSING TELFA 3X8 NADH (GAUZE/BANDAGES/DRESSINGS) IMPLANT
PAD OB MATERNITY 4.3X12.25 (PERSONAL CARE ITEMS) ×2 IMPLANT
PAD PREP 24X41 OB/GYN DISP (PERSONAL CARE ITEMS) ×2 IMPLANT
SCRUB CHG 4% DYNA-HEX 4OZ (MISCELLANEOUS) ×1 IMPLANT
SET BERKELEY SUCTION TUBING (SUCTIONS) ×2 IMPLANT
SET CYSTO W/LG BORE CLAMP LF (SET/KITS/TRAYS/PACK) IMPLANT
TOWEL OR 17X26 4PK STRL BLUE (TOWEL DISPOSABLE) ×2 IMPLANT
TRAP TISSUE FILTER (MISCELLANEOUS) ×2 IMPLANT
VACURETTE 10 RIGID CVD (CANNULA) ×2 IMPLANT
VACURETTE 6 ASPIR F TIP BERK (CANNULA) ×1 IMPLANT
VACURETTE 7MM F TIP (CANNULA)
VACURETTE 7MM F TIP STRL (CANNULA) ×1 IMPLANT
VACURETTE 8 RIGID CVD (CANNULA) ×1 IMPLANT
VACURETTE 8MM F TIP (MISCELLANEOUS) ×2 IMPLANT

## 2021-08-07 NOTE — Brief Op Note (Signed)
08/07/2021  8:10 AM  PATIENT:  Sarah Summers  41 y.o. female  PRE-OPERATIVE DIAGNOSIS:  missed abortion  POST-OPERATIVE DIAGNOSIS:  missed abortion  PROCEDURE:  Procedure(s): SUCTION D&C (N/A)  SURGEON:  Surgeon(s) and Role:    * Odessa Morren, Ihor Austin, MD - Primary  PHYSICIAN ASSISTANT:   ASSISTANTS: none   ANESTHESIA:   general  EBL:  100 mL IOF 500c  Uo 50 cc  BLOOD ADMINISTERED:none  DRAINS: none   LOCAL MEDICATIONS USED:  NONE  SPECIMEN:  Source of Specimen:  POC  DISPOSITION OF SPECIMEN:  PATHOLOGY  COUNTS:  YES  TOURNIQUET:  * No tourniquets in log *  DICTATION: .Other Dictation: Dictation Number verbal  PLAN OF CARE: Discharge to home after PACU  PATIENT DISPOSITION:  PACU - hemodynamically stable.   Delay start of Pharmacological VTE agent (>24hrs) due to surgical blood loss or risk of bleeding: not applicable

## 2021-08-07 NOTE — Transfer of Care (Signed)
Immediate Anesthesia Transfer of Care Note  Patient: Sarah Summers  Procedure(s) Performed: SUCTION D&C (Uterus)  Patient Location: PACU  Anesthesia Type:General  Level of Consciousness: awake and drowsy  Airway & Oxygen Therapy: Patient Spontanous Breathing  Post-op Assessment: Report given to RN and Post -op Vital signs reviewed and stable  Post vital signs: Reviewed and stable  Last Vitals:  Vitals Value Taken Time  BP 96/52 08/07/21 0817  Temp 36.7 C 08/07/21 0816  Pulse 74 08/07/21 0822  Resp 15 08/07/21 0822  SpO2 100 % 08/07/21 0822  Vitals shown include unvalidated device data.  Last Pain:  Vitals:   08/07/21 0816  TempSrc:   PainSc: Asleep         Complications: No notable events documented.

## 2021-08-07 NOTE — Progress Notes (Signed)
Pt is ready for Suction D+C  Labs reviewed . Proceed

## 2021-08-07 NOTE — Anesthesia Preprocedure Evaluation (Signed)
Anesthesia Evaluation  Patient identified by MRN, date of birth, ID band Patient awake    Reviewed: Allergy & Precautions, NPO status , Patient's Chart, lab work & pertinent test results  Airway Mallampati: II  TM Distance: >3 FB Neck ROM: Full    Dental  (+) Teeth Intact   Pulmonary neg pulmonary ROS, Patient abstained from smoking., former smoker,    Pulmonary exam normal breath sounds clear to auscultation       Cardiovascular negative cardio ROS Normal cardiovascular exam Rhythm:Regular Rate:Normal     Neuro/Psych Anxiety Depression negative neurological ROS  negative psych ROS   GI/Hepatic negative GI ROS, Neg liver ROS, GERD  ,  Endo/Other  negative endocrine ROS  Renal/GU Renal disease  negative genitourinary   Musculoskeletal negative musculoskeletal ROS (+)   Abdominal Normal abdominal exam  (+)   Peds negative pediatric ROS (+)  Hematology negative hematology ROS (+)   Anesthesia Other Findings Past Medical History: No date: Anxiety 2008: Bell palsy No date: Depression No date: Family history of adverse reaction to anesthesia     Comment:  mother has N/V No date: GERD (gastroesophageal reflux disease) 2008: Hyperthyroidism  Past Surgical History: 2006: BUNIONECTOMY; Right 2008: DILATION AND CURETTAGE OF UTERUS 2003: TONSILLECTOMY AND ADENOIDECTOMY  BMI    Body Mass Index: 21.44 kg/m      Reproductive/Obstetrics negative OB ROS                             Anesthesia Physical Anesthesia Plan  ASA: 2  Anesthesia Plan: General   Post-op Pain Management:    Induction: Intravenous  PONV Risk Score and Plan: Dexamethasone, Ondansetron, Midazolam and Treatment may vary due to age or medical condition  Airway Management Planned: LMA  Additional Equipment:   Intra-op Plan:   Post-operative Plan: Extubation in OR  Informed Consent: I have reviewed the patients  History and Physical, chart, labs and discussed the procedure including the risks, benefits and alternatives for the proposed anesthesia with the patient or authorized representative who has indicated his/her understanding and acceptance.     Dental Advisory Given  Plan Discussed with: CRNA and Surgeon  Anesthesia Plan Comments:         Anesthesia Quick Evaluation

## 2021-08-07 NOTE — Discharge Instructions (Signed)

## 2021-08-07 NOTE — Anesthesia Procedure Notes (Signed)
Procedure Name: LMA Insertion Date/Time: 08/07/2021 7:41 AM Performed by: Reece Agar, CRNA Pre-anesthesia Checklist: Patient identified, Emergency Drugs available, Suction available and Patient being monitored Patient Re-evaluated:Patient Re-evaluated prior to induction Oxygen Delivery Method: Circle system utilized Preoxygenation: Pre-oxygenation with 100% oxygen Induction Type: IV induction Ventilation: Mask ventilation without difficulty LMA: LMA inserted LMA Size: 4.0 Number of attempts: 1 Placement Confirmation: positive ETCO2 and breath sounds checked- equal and bilateral Tube secured with: Tape Dental Injury: Teeth and Oropharynx as per pre-operative assessment

## 2021-08-07 NOTE — Op Note (Signed)
NAME: Sarah Summers, Sarah Summers MEDICAL RECORD NO: [id number] ACCOUNT NO: [id number] DATE OF BIRTH: 01-Aug-1980 FACILITY: [facility] LOCATION: [facility] PHYSICIAN: Suzy Bouchard, MD  Operative Report   DATE OF PROCEDURE: 08/07/2021  PREOPERATIVE DIAGNOSIS:  Missed abortion.  POSTOPERATIVE DIAGNOSIS:  Missed abortion.  PROCEDURE:  Suction dilation and curettage.  SURGEON:  Jennell Corner, MD  ANESTHESIA:  General endotracheal anesthesia.  INDICATIONS:  A 41 year old gravida 4, para 2 patient at 12 weeks 13 days when she presented to an ultrasound at [facility].  This showed fetal size 11 weeks 4 days, with no fetal heart motion.  The patient's blood type A positive.  DESCRIPTION OF PROCEDURE:  After adequate general endotracheal anesthesia, the patient was placed in dorsal supine position with legs in the candy cane stirrups.  The patient's abdomen, perineum and vagina were prepped and draped in normal sterile  fashion.  The patient did receive 200 mg intravenous doxycycline prior to commencement of the case.  Timeout was performed.  Straight catheterization of the bladder yielded 50 mL clear urine.  Weighted speculum was placed in the posterior vaginal vault  and the anterior cervix was grasped with a single tooth tenaculum.  Cervix was dilated to #20 Hanks dilator without difficulty.  The Hegar dilators were used as well.  A #8 flexible suction curette was placed into the endometrial cavity and a large  amount of tissue consistent with products of conception were removed.  Randall stone forceps were used with teasing out of additional tissue.  A #10 rigid suction curette was placed into the endometrial cavity with no additional tissue.  Sharp curettage  was performed with good uterine cry throughout and repeat flexible suction curette with no additional tissue.  Good hemostasis noted.  Silver nitrate was used on the tenaculum sites.  The patient tolerated the procedure  well.  ESTIMATED BLOOD LOSS:  100 mL.  INTRAOPERATIVE FLUIDS:  500 mL.  URINE OUTPUT:  50 mL. There were no complications.  The patient was taken to recovery room in good condition.   SHW D: 08/07/2021 8:18:35 am T: 08/07/2021 9:26:00 am  JOB: [id number]/ [id number]

## 2021-08-07 NOTE — Progress Notes (Signed)
°   08/07/21 0700  Clinical Encounter Type  Visited With Patient  Visit Type Initial;Pre-op  Spiritual Encounters  Spiritual Needs Prayer   Chaplain provided pre-op support through compassionate presence, meaningful conversation, and prayer.

## 2021-08-08 ENCOUNTER — Encounter: Payer: Self-pay | Admitting: Obstetrics and Gynecology

## 2021-08-08 LAB — SURGICAL PATHOLOGY

## 2021-08-20 NOTE — Anesthesia Postprocedure Evaluation (Signed)
Anesthesia Post Note ? ?Patient: Alvina A Snuffer ? ?Procedure(s) Performed: SUCTION D&C (Uterus) ? ?Patient location during evaluation: PACU ?Anesthesia Type: General ?Level of consciousness: awake and awake and alert ?Pain management: satisfactory to patient ?Vital Signs Assessment: post-procedure vital signs reviewed and stable ?Respiratory status: spontaneous breathing and respiratory function stable ?Cardiovascular status: blood pressure returned to baseline ?Anesthetic complications: no ? ? ?No notable events documented. ? ? ?Last Vitals:  ?Vitals:  ? 08/07/21 0853 08/07/21 0902  ?BP: (!) 101/59 91/60  ?Pulse:  68  ?Resp: 17 20  ?Temp:  (!) 36.1 ?C  ?SpO2: 99% 94%  ?  ?Last Pain:  ?Vitals:  ? 08/08/21 0910  ?TempSrc:   ?PainSc: 6   ? ? ?  ?  ?  ?  ?  ?  ? ?VAN STAVEREN,Maykel Reitter ? ? ? ? ?

## 2021-09-17 NOTE — H&P (Signed)
Sarah Summers is a 41 y.o. female here for L/S BTL  ?. ?  ?Pt is here interested in discussion of permanent sterilization . She is s/p D+C for Missed Ab.  ?  ?  ?Past Medical History:  has a past medical history of Anxiety (2001), Depression, GERD (gastroesophageal reflux disease), and History of abnormal cervical Pap smear (2001).  ?Past Surgical History:  has a past surgical history that includes Fracture surgery; Tonsillectomy; and Dilation and curettage, diagnostic / therapeutic (08/07/2021). ?Family History: family history includes Breast cancer in her maternal grandmother; Colon cancer in her paternal grandmother; Mental illness in her paternal grandmother. ?Social History:  reports that she has been smoking cigarettes. She started smoking about 11 years ago. She has a 27.00 pack-year smoking history. She has never used smokeless tobacco. She reports that she does not drink alcohol and does not use drugs. ?OB/GYN History:  ?        ?OB History   ?  Gravida  ?4  ? Para  ?2  ? Term  ?2  ? Preterm  ?   ? AB  ?2  ? Living  ?2  ?  ?  SAB  ?1  ? IAB  ?   ? Ectopic  ?   ? Molar  ?   ? Multiple  ?   ? Live Births  ?2  ?  ?  ?   ?  ?  ?Allergies: has No Known Allergies. ?Medications: ?  ?Current Outpatient Medications:  ?  sertraline (ZOLOFT) 100 MG tablet, Take 0.5 tablets (50 mg total) by mouth once daily TAKE ONE-HALF TABLET BY MOUTH DAILY, Disp: 45 tablet, Rfl: 0 ?  traZODone (DESYREL) 50 MG tablet, ANYWHERE FROM 1/2 TAB TO 3 WHOLE TABS AT BEDTIME FOR SLEEP., Disp: 90 tablet, Rfl: 3 ?  albuterol 90 mcg/actuation inhaler, Inhale 2 inhalations into the lungs every 6 (six) hours as needed for Wheezing for up to 30 days (Patient not taking: Reported on 06/30/2021), Disp: 1 Inhaler, Rfl: 0 ?  cholecalciferol (VITAMIN D3) 2,000 unit tablet, Take 2 tablets (4,000 Units total) by mouth once daily (Patient not taking: Reported on 06/30/2021), Disp: 30 tablet, Rfl: 11 ?  DM/p-ephed/acetaminoph/doxylam (VICKS NYQUIL LIQUICAPS  ORAL), Take by mouth (Patient not taking: Reported on 08/06/2021), Disp: , Rfl:  ?  HYDROcodone-acetaminophen (NORCO) 5-325 mg tablet, Take 1 tablet by mouth every 8 (eight) hours as needed for Pain POST OP 1-2 TABLETS EVERY 4 -6 HOURS PRN (Patient not taking: Reported on 08/26/2021), Disp: 5 tablet, Rfl: 0 ?  ibuprofen (MOTRIN) 800 MG tablet, Take 1 tablet (800 mg total) by mouth every 8 (eight) hours as needed for Pain (Patient not taking: Reported on 08/26/2021), Disp: 30 tablet, Rfl: 1 ?  pediatric multivitamin no.76 (FLINTSTONES COMPLETE) Chew, Take 2 tablets by mouth, Disp: , Rfl:  ?  promethazine (PHENERGAN) 25 MG tablet, Take 1 tablet (25 mg total) by mouth every 6 (six) hours as needed for Nausea (Patient not taking: Reported on 08/06/2021), Disp: 30 tablet, Rfl: 1 ?  ?Review of Systems: ?General:                      No fatigue or weight loss ?Eyes:                           No vision changes ?Ears:  No hearing difficulty ?Respiratory:                No cough or shortness of breath ?Pulmonary:                  No asthma or shortness of breath ?Cardiovascular:           No chest pain, palpitations, dyspnea on exertion ?Gastrointestinal:          No abdominal bloating, chronic diarrhea, constipations, masses, pain or hematochezia ?Genitourinary:             No hematuria, dysuria, abnormal vaginal discharge, pelvic pain, Menometrorrhagia ?Lymphatic:                   No swollen lymph nodes ?Musculoskeletal:         No muscle weakness ?Neurologic:                  No extremity weakness, syncope, seizure disorder ?Psychiatric:                  No history of depression, delusions or suicidal/homicidal ideation ?  ?  ? Exam:  ?  ?   ?Vitals:  ?  09/19/21  1111  ?BP: 105/67  ?Pulse: 80  ?  ?  ?Body mass index is 22.71 kg/m?. ?  ?WDWN white/  female in NAD   ?Lungs: CTA  ?CV : RRR without murmur   ?Abdomen: soft , no mass, normal active bowel sounds,  non-tender, no rebound tendernessPelvic:  tanner stage 5 ,  ?External genitalia: vulva /labia no lesions ?Urethra: no prolapse ?Vagina: normal physiologic d/c ?Cervix: no lesions, no cervical motion tenderness   ?Uterus: normal size shape and contour, non-tender ?Adnexa: no mass,  non-tender   ?Rectovaginal: ?  ?Impression:  ?  ?The encounter diagnosis was Consultation for female sterilization. ?  ?  ?  ?Plan:  ? l/s BTL - falope rings  ?I have counseled her on the procedure and the risks involved .  ?Failure rate 1: 300  ?Benefits and risks to surgery: ?The proposed benefit of the surgery has been discussed with the patient. ?The possible risks include, but are not limited to: organ injury to the bowel , bladder, ureters, and major blood vessels and nerves. There is a possibility of additional surgeries resulting from these injuries. There is also the risk of blood transfusion and the need to receive blood products during or after the procedure which may rarely lead to HIV or Hepatitis C infection. There is a risk of developing a deep venous thrombosis or a pulmonary embolism . There is the possibility of wound infection and also anesthetic complications, even the rare possibility of death. The patient understands these risks and wishes to proceed. All questions have been answered and the consent has been signed. ?  ?  ?Medicaid consent signed today  ?  ?No follow-ups on file. ?  ?Vilma Prader, MD ?

## 2021-09-24 ENCOUNTER — Other Ambulatory Visit: Payer: Self-pay

## 2021-09-24 ENCOUNTER — Encounter
Admission: RE | Admit: 2021-09-24 | Discharge: 2021-09-24 | Disposition: A | Discharge status: Home / Self Care | Payer: Medicaid Other | Source: Ambulatory Visit | Attending: Obstetrics and Gynecology | Admitting: Obstetrics and Gynecology

## 2021-09-24 HISTORY — DX: Other specified postprocedural states: Z98.890

## 2021-09-24 NOTE — Patient Instructions (Addendum)
Your procedure is scheduled on: Friday 10/03/21 ?Report to the Registration Desk on the 1st floor of the [facility]. ?To find out your arrival time, please call [phone] between 1PM - 3PM on: Thursday 10/02/21 ? ?REMEMBER: ?Instructions that are not followed completely may result in serious medical risk, up to and including death; or upon the discretion of your surgeon and anesthesiologist your surgery may need to be rescheduled. ? ?Do not eat food after midnight the night before surgery.  ?No gum chewing, lozengers or hard candies. ? ?You may however, drink CLEAR liquids up to 2 hours before you are scheduled to arrive for your surgery. Do not drink anything within 2 hours of your scheduled arrival time. ? ?Clear liquids include: ?- water  ?- apple juice without pulp ?- gatorade (not RED colors) ?- black coffee or tea (Do NOT add milk or creamers to the coffee or tea) ?Do NOT drink anything that is not on this list. ? ?TAKE THESE MEDICATIONS THE MORNING OF SURGERY WITH A SIP OF WATER: ?sertraline (ZOLOFT) 100 MG tablet ? ?omeprazole (PRILOSEC) 20 MG capsule (take one the night before and one on the morning of surgery - helps to prevent nausea after surgery.) ? ?One week prior to surgery: ?Stop Anti-inflammatories (NSAIDS) such as Advil, Aleve, Ibuprofen, Motrin, Naproxen, Naprosyn and Aspirin based products such as Excedrin, Goodys Powder, BC Powder. ? ?Stop ANY OVER THE COUNTER supplements until after surgery. ? ?You may however, continue to take Tylenol if needed for pain up until the day of surgery. ? ?No Alcohol for 24 hours before or after surgery. ? ?No Smoking including e-cigarettes for 24 hours prior to surgery.  ?No chewable tobacco products for at least 6 hours prior to surgery.  ?No nicotine patches on the day of surgery. ? ?Do not use any "recreational" drugs for at least a week prior to your surgery.  ?Please be advised that the combination of cocaine and anesthesia may have negative outcomes, up  to and including death. ?If you test positive for cocaine, your surgery will be cancelled. ? ?On the morning of surgery brush your teeth with toothpaste and water, you may rinse your mouth with mouthwash if you wish. ?Do not swallow any toothpaste or mouthwash. ? ?Do not wear jewelry, make-up, hairpins, clips or nail polish. ? ?Do not wear lotions, powders, or perfumes.  ? ?Do not shave body from the neck down 48 hours prior to surgery just in case you cut yourself which could leave a site for infection.  ?Also, freshly shaved skin may become irritated if using the CHG soap. ? ?Do not bring valuables to the hospital. [facility] is not responsible for any missing/lost belongings or valuables.  ? ?Notify your doctor if there is any change in your medical condition (cold, fever, infection). ? ?Wear comfortable clothing (specific to your surgery type) to the hospital. ? ?After surgery, you can help prevent lung complications by doing breathing exercises.  ?Take deep breaths and cough every 1-2 hours. Your doctor may order a device called an Incentive Spirometer to help you take deep breaths. ? ?If you are being discharged the day of surgery, you will not be allowed to drive home. ?You will need a responsible adult (18 years or older) to drive you home and stay with you that night.  ? ?If you are taking public transportation, you will need to have a responsible adult (18 years or older) with you. ?Please confirm with your physician that it is  acceptable to use public transportation.  ? ?Please call the Pre-admissions Testing Dept. at [phone] if you have any questions about these instructions. ? ?Surgery Visitation Policy: ? ?Patients undergoing a surgery or procedure may have two family members or support persons with them as long as the person is not COVID-19 positive or experiencing its symptoms.  ? ?Inpatient Visitation:   ? ?Visiting hours are 7 a.m. to 8 p.m. ?Up to four visitors are allowed at one time in a  patient room, including children. The visitors may rotate out with other people during the day. One designated support person (adult) may remain overnight.  ?

## 2021-09-26 ENCOUNTER — Encounter
Admission: RE | Admit: 2021-09-26 | Discharge: 2021-09-26 | Disposition: A | Discharge status: Home / Self Care | Payer: Medicaid Other | Source: Ambulatory Visit | Attending: Obstetrics and Gynecology | Admitting: Obstetrics and Gynecology

## 2021-09-26 ENCOUNTER — Other Ambulatory Visit: Admission: RE | Admit: 2021-09-26 | Payer: Medicaid Other | Source: Ambulatory Visit

## 2021-09-26 DIAGNOSIS — Z01818 Encounter for other preprocedural examination: Secondary | ICD-10-CM

## 2021-09-26 DIAGNOSIS — Z01812 Encounter for preprocedural laboratory examination: Secondary | ICD-10-CM | POA: Diagnosis not present

## 2021-09-26 LAB — BASIC METABOLIC PANEL
Anion gap: 10 (ref 5–15)
BUN: 18 mg/dL (ref 6–20)
CO2: 27 mmol/L (ref 22–32)
Calcium: 9.2 mg/dL (ref 8.9–10.3)
Chloride: 103 mmol/L (ref 98–111)
Creatinine, Ser: 0.76 mg/dL (ref 0.44–1.00)
GFR, Estimated: >60 mL/min (ref >60)
Glucose, Bld: 95 mg/dL (ref 70–99)
Potassium: 3.5 mmol/L (ref 3.5–5.1)
Sodium: 140 mmol/L (ref 135–145)

## 2021-09-26 LAB — CBC
HCT: 42.4 % (ref 36.0–46.0)
Hemoglobin: 13.9 g/dL (ref 12.0–15.0)
MCH: 31 pg (ref 26.0–34.0)
MCHC: 32.8 g/dL (ref 30.0–36.0)
MCV: 94.6 fL (ref 80.0–100.0)
Platelets: 193 10*3/uL (ref 150–400)
RBC: 4.48 MIL/uL (ref 3.87–5.11)
RDW: 12.5 % (ref 11.5–15.5)
WBC: 5.6 10*3/uL (ref 4.0–10.5)
nRBC: 0 % (ref 0.0–0.2)

## 2021-09-26 LAB — TYPE AND SCREEN
ABO/RH(D): A POS
Antibody Screen: NEGATIVE
Extend sample reason: UNDETERMINED

## 2021-10-02 MED ORDER — CHLORHEXIDINE GLUCONATE 0.12 % MT SOLN
15.0000 mL | Freq: Once | OROMUCOSAL | Status: AC
  Administered 2021-10-03: 15 mL via OROMUCOSAL

## 2021-10-02 MED ORDER — LACTATED RINGERS IV SOLN: INTRAVENOUS | Status: DC

## 2021-10-02 MED ORDER — ACETAMINOPHEN 500 MG PO TABS
1000.0000 mg | ORAL_TABLET | ORAL | Status: AC
  Administered 2021-10-03: 1000 mg via ORAL

## 2021-10-02 MED ORDER — LACTATED RINGERS IV SOLN
INTRAVENOUS | Status: DC
Start: 2021-10-02 — End: 2021-10-03

## 2021-10-02 MED ORDER — ORAL CARE MOUTH RINSE: 15.0000 mL | Freq: Once | OROMUCOSAL | Status: AC

## 2021-10-02 MED ORDER — GABAPENTIN 300 MG PO CAPS
300.0000 mg | ORAL_CAPSULE | ORAL | Status: AC
  Administered 2021-10-03: 300 mg via ORAL

## 2021-10-02 MED ORDER — POVIDONE-IODINE 10 % EX SWAB: 2.0000 "application " | Freq: Once | CUTANEOUS | Status: DC

## 2021-10-03 ENCOUNTER — Ambulatory Visit
Admission: RE | Admit: 2021-10-03 | Discharge: 2021-10-03 | Disposition: A | Discharge status: Home / Self Care | Payer: Medicaid Other | Attending: Obstetrics and Gynecology | Admitting: Obstetrics and Gynecology

## 2021-10-03 ENCOUNTER — Ambulatory Visit: Payer: Medicaid Other | Admitting: Certified Registered Nurse Anesthetist

## 2021-10-03 ENCOUNTER — Encounter: Payer: Self-pay | Admitting: Obstetrics and Gynecology

## 2021-10-03 ENCOUNTER — Other Ambulatory Visit: Payer: Self-pay

## 2021-10-03 ENCOUNTER — Encounter
Admission: RE | Disposition: A | Discharge status: Home / Self Care | Payer: Self-pay | Source: Home / Self Care | Attending: Obstetrics and Gynecology

## 2021-10-03 DIAGNOSIS — Z8759 Personal history of other complications of pregnancy, childbirth and the puerperium: Secondary | ICD-10-CM | POA: Diagnosis not present

## 2021-10-03 DIAGNOSIS — F17210 Nicotine dependence, cigarettes, uncomplicated: Secondary | ICD-10-CM | POA: Insufficient documentation

## 2021-10-03 DIAGNOSIS — Z302 Encounter for sterilization: Secondary | ICD-10-CM | POA: Diagnosis not present

## 2021-10-03 DIAGNOSIS — Z01818 Encounter for other preprocedural examination: Secondary | ICD-10-CM

## 2021-10-03 HISTORY — PX: LAPAROSCOPIC TUBAL LIGATION: SHX1937

## 2021-10-03 LAB — POCT PREGNANCY, URINE: Preg Test, Ur: NEGATIVE

## 2021-10-03 LAB — TYPE AND SCREEN
ABO/RH(D): A POS
Antibody Screen: NEGATIVE

## 2021-10-03 SURGERY — LIGATION, FALLOPIAN TUBE, LAPAROSCOPIC
Anesthesia: General | Site: Abdomen | Laterality: Bilateral

## 2021-10-03 MED ORDER — OXYCODONE HCL 5 MG PO TABS: 5.0000 mg | ORAL_TABLET | ORAL | Status: DC | PRN

## 2021-10-03 MED ORDER — OXYCODONE HCL 5 MG PO TABS
5.0000 mg | ORAL_TABLET | Freq: Once | ORAL | Status: AC
  Administered 2021-10-03: 5 mg via ORAL

## 2021-10-03 MED ORDER — ONDANSETRON HCL 4 MG/2ML IJ SOLN
INTRAMUSCULAR | Status: AC
Start: 2021-10-03 — End: ?
  Filled 2021-10-03: qty 2

## 2021-10-03 MED ORDER — LIDOCAINE HCL (CARDIAC) PF 100 MG/5ML IV SOSY
PREFILLED_SYRINGE | INTRAVENOUS | Status: DC | PRN
  Administered 2021-10-03: 60 mg via INTRAVENOUS

## 2021-10-03 MED ORDER — LIDOCAINE HCL (PF) 2 % IJ SOLN
INTRAMUSCULAR | Status: AC
Start: 2021-10-03 — End: ?
  Filled 2021-10-03: qty 5

## 2021-10-03 MED ORDER — CHLORHEXIDINE GLUCONATE 0.12 % MT SOLN
OROMUCOSAL | Status: AC
  Filled 2021-10-03: qty 15

## 2021-10-03 MED ORDER — EPHEDRINE SULFATE (PRESSORS) 50 MG/ML IJ SOLN
INTRAMUSCULAR | Status: DC | PRN
  Administered 2021-10-03 (×2): 5 mg via INTRAVENOUS

## 2021-10-03 MED ORDER — FENTANYL CITRATE (PF) 100 MCG/2ML IJ SOLN
INTRAMUSCULAR | Status: AC
Start: 2021-10-03 — End: ?
  Filled 2021-10-03: qty 2

## 2021-10-03 MED ORDER — 0.9 % SODIUM CHLORIDE (POUR BTL) OPTIME
TOPICAL | Status: DC | PRN
  Administered 2021-10-03: 500 mL

## 2021-10-03 MED ORDER — FENTANYL CITRATE (PF) 100 MCG/2ML IJ SOLN
INTRAMUSCULAR | Status: AC
  Administered 2021-10-03: 25 ug via INTRAVENOUS
  Filled 2021-10-03: qty 2

## 2021-10-03 MED ORDER — BUPIVACAINE HCL (PF) 0.5 % IJ SOLN
INTRAMUSCULAR | Status: AC
  Filled 2021-10-03: qty 30

## 2021-10-03 MED ORDER — FENTANYL CITRATE (PF) 100 MCG/2ML IJ SOLN
25.0000 ug | INTRAMUSCULAR | Status: DC | PRN
  Administered 2021-10-03: 50 ug via INTRAVENOUS
  Administered 2021-10-03: 25 ug via INTRAVENOUS
  Administered 2021-10-03: 50 ug via INTRAVENOUS
  Administered 2021-10-03: 25 ug via INTRAVENOUS

## 2021-10-03 MED ORDER — ROCURONIUM BROMIDE 10 MG/ML (PF) SYRINGE
PREFILLED_SYRINGE | INTRAVENOUS | Status: AC
  Filled 2021-10-03: qty 10

## 2021-10-03 MED ORDER — ONDANSETRON HCL 4 MG/2ML IJ SOLN
INTRAMUSCULAR | Status: DC | PRN
  Administered 2021-10-03: 4 mg via INTRAVENOUS

## 2021-10-03 MED ORDER — PROPOFOL 10 MG/ML IV BOLUS
INTRAVENOUS | Status: AC
  Filled 2021-10-03: qty 20

## 2021-10-03 MED ORDER — FENTANYL CITRATE (PF) 100 MCG/2ML IJ SOLN
INTRAMUSCULAR | Status: DC | PRN
  Administered 2021-10-03 (×2): 50 ug via INTRAVENOUS

## 2021-10-03 MED ORDER — KETOROLAC TROMETHAMINE 30 MG/ML IJ SOLN
INTRAMUSCULAR | Status: DC | PRN
  Administered 2021-10-03: 30 mg via INTRAVENOUS

## 2021-10-03 MED ORDER — ONDANSETRON HCL 4 MG/2ML IJ SOLN: 4.0000 mg | Freq: Once | INTRAMUSCULAR | Status: DC | PRN

## 2021-10-03 MED ORDER — ACETAMINOPHEN 500 MG PO TABS
ORAL_TABLET | ORAL | Status: AC
  Filled 2021-10-03: qty 2

## 2021-10-03 MED ORDER — ONDANSETRON HCL 4 MG PO TABS: 4.0000 mg | ORAL_TABLET | Freq: Once | ORAL | Status: DC

## 2021-10-03 MED ORDER — DEXAMETHASONE SODIUM PHOSPHATE 10 MG/ML IJ SOLN
INTRAMUSCULAR | Status: DC | PRN
  Administered 2021-10-03: 10 mg via INTRAVENOUS

## 2021-10-03 MED ORDER — MIDAZOLAM HCL 2 MG/2ML IJ SOLN
INTRAMUSCULAR | Status: DC | PRN
Start: 2021-10-03 — End: 2021-10-03
  Administered 2021-10-03: 2 mg via INTRAVENOUS

## 2021-10-03 MED ORDER — KETOROLAC TROMETHAMINE 30 MG/ML IJ SOLN
INTRAMUSCULAR | Status: AC
  Filled 2021-10-03: qty 1

## 2021-10-03 MED ORDER — DEXAMETHASONE SODIUM PHOSPHATE 10 MG/ML IJ SOLN
INTRAMUSCULAR | Status: AC
  Filled 2021-10-03: qty 1

## 2021-10-03 MED ORDER — BUPIVACAINE HCL 0.5 % IJ SOLN
INTRAMUSCULAR | Status: DC | PRN
  Administered 2021-10-03: 5 mL

## 2021-10-03 MED ORDER — PROPOFOL 10 MG/ML IV BOLUS
INTRAVENOUS | Status: DC | PRN
  Administered 2021-10-03: 130 mg via INTRAVENOUS

## 2021-10-03 MED ORDER — OXYCODONE HCL 5 MG PO TABS
ORAL_TABLET | ORAL | Status: AC
  Filled 2021-10-03: qty 1

## 2021-10-03 MED ORDER — GABAPENTIN 300 MG PO CAPS
ORAL_CAPSULE | ORAL | Status: DC
Start: 2021-10-03 — End: 2021-10-03
  Filled 2021-10-03: qty 1

## 2021-10-03 MED ORDER — MIDAZOLAM HCL 2 MG/2ML IJ SOLN
INTRAMUSCULAR | Status: AC
  Filled 2021-10-03: qty 2

## 2021-10-03 MED ORDER — ROCURONIUM BROMIDE 100 MG/10ML IV SOLN
INTRAVENOUS | Status: DC | PRN
  Administered 2021-10-03: 10 mg via INTRAVENOUS
  Administered 2021-10-03: 40 mg via INTRAVENOUS

## 2021-10-03 SURGICAL SUPPLY — 41 items
APL PRP STRL LF DISP 70% ISPRP (MISCELLANEOUS) ×1
APR BAND 32X8 2 INCS BAND (Ring) ×1 IMPLANT
BACTOSHIELD CHG 4% 4OZ (MISCELLANEOUS) ×1
BLADE SURG SZ11 CARB STEEL (BLADE) ×2 IMPLANT
CATH ROBINSON RED A/P 16FR (CATHETERS) ×2 IMPLANT
CHLORAPREP W/TINT 26 (MISCELLANEOUS) ×2 IMPLANT
DRSG TEGADERM 2-3/8X2-3/4 SM (GAUZE/BANDAGES/DRESSINGS) ×4 IMPLANT
GAUZE 4X4 16PLY ~~LOC~~+RFID DBL (SPONGE) ×2 IMPLANT
GLOVE SURG SYN 8.0 (GLOVE) ×2 IMPLANT
GLOVE SURG SYN 8.0 PF PI (GLOVE) ×1 IMPLANT
GOWN STRL REUS W/ TWL LRG LVL3 (GOWN DISPOSABLE) ×1 IMPLANT
GOWN STRL REUS W/ TWL XL LVL3 (GOWN DISPOSABLE) ×2 IMPLANT
GOWN STRL REUS W/TWL LRG LVL3 (GOWN DISPOSABLE) ×2
GOWN STRL REUS W/TWL XL LVL3 (GOWN DISPOSABLE) ×4
GRASPER SUT TROCAR 14GX15 (MISCELLANEOUS) IMPLANT
KIT DISPOSABLE FALLOPE RING (Ring) ×2 IMPLANT
KIT PINK PAD W/HEAD ARE REST (MISCELLANEOUS) ×2
KIT PINK PAD W/HEAD ARM REST (MISCELLANEOUS) ×1 IMPLANT
KIT TURNOVER CYSTO (KITS) ×2 IMPLANT
LABEL OR SOLS (LABEL) ×2 IMPLANT
MANIFOLD NEPTUNE II (INSTRUMENTS) ×2 IMPLANT
NDL HPO THNWL 1X22GA REG BVL (NEEDLE) ×1 IMPLANT
NEEDLE SAFETY 22GX1 (NEEDLE) ×2
NS IRRIG 500ML POUR BTL (IV SOLUTION) ×2 IMPLANT
PACK GYN LAPAROSCOPIC (MISCELLANEOUS) ×2 IMPLANT
PAD OB MATERNITY 4.3X12.25 (PERSONAL CARE ITEMS) ×2 IMPLANT
PAD PREP 24X41 OB/GYN DISP (PERSONAL CARE ITEMS) ×2 IMPLANT
SCRUB CHG 4% DYNA-HEX 4OZ (MISCELLANEOUS) ×1 IMPLANT
SET TUBE SMOKE EVAC HIGH FLOW (TUBING) ×2 IMPLANT
SHEARS HARMONIC ACE PLUS 36CM (ENDOMECHANICALS) IMPLANT
SLEEVE ENDOPATH XCEL 5M (ENDOMECHANICALS) IMPLANT
SPONGE GAUZE 2X2 8PLY STRL LF (GAUZE/BANDAGES/DRESSINGS) ×2 IMPLANT
STRIP CLOSURE SKIN 1/4X4 (GAUZE/BANDAGES/DRESSINGS) ×2 IMPLANT
SUT VIC AB 0 CT1 36 (SUTURE) IMPLANT
SUT VIC AB 2-0 UR6 27 (SUTURE) IMPLANT
SUT VIC AB 4-0 SH 27 (SUTURE) ×2
SUT VIC AB 4-0 SH 27XANBCTRL (SUTURE) ×1 IMPLANT
SWABSTK COMLB BENZOIN TINCTURE (MISCELLANEOUS) IMPLANT
TROCAR ENDO BLADELESS 11MM (ENDOMECHANICALS) IMPLANT
TROCAR XCEL NON-BLD 5MMX100MML (ENDOMECHANICALS) ×2 IMPLANT
WATER STERILE IRR 500ML POUR (IV SOLUTION) ×2 IMPLANT

## 2021-10-03 NOTE — Anesthesia Procedure Notes (Signed)
Procedure Name: Intubation ?Date/Time: 10/03/2021 10:15 AM ?Performed by: Malva Cogan, CRNA ?Pre-anesthesia Checklist: Patient identified, Patient being monitored, Timeout performed, Emergency Drugs available and Suction available ?Patient Re-evaluated:Patient Re-evaluated prior to induction ?Oxygen Delivery Method: Circle system utilized ?Preoxygenation: Pre-oxygenation with 100% oxygen ?Induction Type: IV induction ?Ventilation: Mask ventilation without difficulty ?Laryngoscope Size: 3 and McGraph ?Grade View: Grade I ?Tube type: Oral ?Tube size: 6.5 mm ?Number of attempts: 1 ?Airway Equipment and Method: Stylet ?Placement Confirmation: ETT inserted through vocal cords under direct vision, positive ETCO2 and breath sounds checked- equal and bilateral ?Secured at: 21 cm ?Tube secured with: Tape ?Dental Injury: Teeth and Oropharynx as per pre-operative assessment  ? ? ? ? ?

## 2021-10-03 NOTE — Progress Notes (Signed)
Pt ready for BTL , L/S . Labs reviewed all questions answered . Proceed  ?

## 2021-10-03 NOTE — Anesthesia Preprocedure Evaluation (Addendum)
Anesthesia Evaluation  ?Patient identified by MRN, date of birth, ID band ?Patient awake ? ? ? ?Reviewed: ?Allergy & Precautions, H&P , NPO status , Patient's Chart, lab work & pertinent test results, reviewed documented beta blocker date and time  ? ?Airway ?Mallampati: II ? ?TM Distance: >3 FB ?Neck ROM: full ? ? ? Dental ? ?(+) Teeth Intact ?  ?Pulmonary ?neg pulmonary ROS, Patient did not abstain from smoking., former smoker,  ?  ?Pulmonary exam normal ? ? ? ? ? ? ? Cardiovascular ?Exercise Tolerance: Good ?negative cardio ROS ?Normal cardiovascular exam ?Rhythm:regular Rate:Normal ? ? ?  ?Neuro/Psych ?PSYCHIATRIC DISORDERS Anxiety Depression  Neuromuscular disease negative neurological ROS ? negative psych ROS  ? GI/Hepatic ?Neg liver ROS, GERD  Medicated,  ?Endo/Other  ?negative endocrine ROSHyperthyroidism  ? Renal/GU ?negative Renal ROS  ?negative genitourinary ?  ?Musculoskeletal ? ? Abdominal ?  ?Peds ? Hematology ?negative hematology ROS ?(+)   ?Anesthesia Other Findings ?Past Medical History: ?No date: Anxiety ?2008: Bell palsy ?No date: Depression ?No date: Family history of adverse reaction to anesthesia ?    Comment:  mother has N/V ?No date: GERD (gastroesophageal reflux disease) ?2001: History of abnormal cervical Pap smear ?No date: History of bunionectomy ?2008: Hyperthyroidism ?Past Surgical History: ?2006: BUNIONECTOMY; Right ?2008: DILATION AND CURETTAGE OF UTERUS ?08/07/2021: DILATION AND EVACUATION; N/A ?    Comment:  Procedure: SUCTION D&C;  Surgeon: Jennell Corner  ?             J, MD;  Location: [facility] ORS;  Service: Gynecology;   ?             Laterality: N/A; ?2003: TONSILLECTOMY AND ADENOIDECTOMY ?BMI   ? Body Mass Index: 21.93 kg/m?  ?  ? Reproductive/Obstetrics ?negative OB ROS ? ?  ? ? ? ? ? ? ? ? ? ? ? ? ? ?  ?  ? ? ? ? ? ? ? ?Anesthesia Physical ?Anesthesia Plan ? ?ASA: 2 ? ?Anesthesia Plan: General ETT  ? ?Post-op Pain Management:    ? ?Induction:  ? ?PONV Risk Score and Plan: 4 or greater ? ?Airway Management Planned:  ? ?Additional Equipment:  ? ?Intra-op Plan:  ? ?Post-operative Plan:  ? ?Informed Consent: I have reviewed the patients History and Physical, chart, labs and discussed the procedure including the risks, benefits and alternatives for the proposed anesthesia with the patient or authorized representative who has indicated his/her understanding and acceptance.  ? ? ? ?Dental Advisory Given and History available from chart only ? ?Plan Discussed with: CRNA ? ?Anesthesia Plan Comments:   ? ? ? ? ? ? ?Anesthesia Quick Evaluation ? ?

## 2021-10-03 NOTE — Discharge Instructions (Signed)

## 2021-10-03 NOTE — Brief Op Note (Signed)
10/03/2021 ? ?10:59 AM ? ?PATIENT:  Sarah Summers  41 y.o. female ? ?PRE-OPERATIVE DIAGNOSIS:  elective sterilization ? ?POST-OPERATIVE DIAGNOSIS:  same as above  ? ?PROCEDURE:  Procedure(s): ?LAPAROSCOPIC TUBAL LIGATION WITH FALOPE RINGS (Bilateral) ? ?SURGEON:  Surgeon(s) and Role: ?   * Johnasia Liese, Ihor Austin, MD - Primary ? ?PHYSICIAN ASSISTANT:  ? ?ASSISTANTS: none  ? ?ANESTHESIA:   general ? ?EBL:  2 mL IOF 600 cc UO 300 cc  ? ?BLOOD ADMINISTERED:none ? ?DRAINS: none  ? ?LOCAL MEDICATIONS USED:  MARCAINE    ? ?SPECIMEN:  No Specimen ? ?DISPOSITION OF SPECIMEN:  N/A ? ?COUNTS:  YES ? ?TOURNIQUET:  * No tourniquets in log * ? ?DICTATION: .Other Dictation: Dictation Number verbal ? ?PLAN OF CARE: Discharge to home after PACU ? ?PATIENT DISPOSITION:  PACU - hemodynamically stable. ?  ?Delay start of Pharmacological VTE agent (>24hrs) due to surgical blood loss or risk of bleeding: not applicable ? ?

## 2021-10-03 NOTE — Transfer of Care (Signed)
Immediate Anesthesia Transfer of Care Note ? ?Patient: Sarah Summers ? ?Procedure(s) Performed: LAPAROSCOPIC TUBAL LIGATION WITH FALOPE RINGS (Bilateral) ? ?Patient Location: PACU ? ?Anesthesia Type:General ? ?Level of Consciousness: awake, alert  and oriented ? ?Airway & Oxygen Therapy: Patient Spontanous Breathing and Patient connected to face mask oxygen ? ?Post-op Assessment: Report given to RN and Post -op Vital signs reviewed and stable ? ?Post vital signs: Reviewed and stable ? ?Last Vitals:  ?Vitals Value Taken Time  ?BP    ?Temp    ?Pulse    ?Resp    ?SpO2    ? ? ?Last Pain:  ?Vitals:  ? 10/03/21 0831  ?TempSrc: Oral  ?PainSc: 0-No pain  ?   ? ?  ? ?Complications: No notable events documented. ?

## 2021-10-04 NOTE — Op Note (Signed)
NAME: Arth, Rosette A. ?MEDICAL RECORD NO: [id number] ?ACCOUNT NO: [id number] ?DATE OF BIRTH: [birth year] ?FACILITY: [facility] ?LOCATION: [facility] ?PHYSICIAN: Suzy Bouchard, MD ? ?Operative Report  ? ?DATE OF PROCEDURE: 10/03/2021 ? ? ?PREOPERATIVE DIAGNOSIS:  Elective permanent sterilization. ? ?POSTOPERATIVE DIAGNOSIS:  Elective permanent sterilization. ? ?PROCEDURE:  Laparoscopic bilateral tubal ligation - Falope rings. ? ?SURGEON:  Suzy Bouchard, MD ? ?ANESTHESIA:  General endotracheal anesthesia. ? ?INDICATIONS:  A 41 year old gravida 4, para 2, patient has elected for permanent sterilization.  The patient is aware of the failure rate of 1/300 per year. ? ?DESCRIPTION OF PROCEDURE:  After adequate general endotracheal anesthesia, the patient was placed in dorsal supine position with the legs in the Windmill stirrups.  The patient's abdomen, perineum and vagina were prepped and draped in normal sterile  ?fashion.  Timeout was performed.  Straight catheterization of the bladder yielded 300 mL clear urine. 2 Ray-Tec sponges were placed on one single ring forceps and placed in the vagina to be used for uterine  ?manipulation.  Surgeon's gloves and gown were changed.  Attention was directed to the patient's abdomen where a 5 mm incision was made infraumbilically after injecting with 0.5% Marcaine.  The 5 mm laparoscope was advanced into the abdominal cavity under ? direct visualization.  The patient's abdomen was insufflated.  A second port was placed 2 cm cephalad to the symphysis pubis.  The 7 mm Falope ring trocar was advanced under direct visualization.  Attention was directed to the patient's right fallopian  ?tube, which was visualized in its entirety and a Falope ring was applied at the isthmic ampullary portion of fallopian tube with the resulting 1.5 cm knuckle of fallopian tube within. Similar procedure was repeated on the patient's left side, again,  ?after visualizing the fimbriated end.  Falope ring was applied at the isthmic ampullary portion of fallopian tube with resulting 1.5 cm knuckle within the Falope ring.  Upper abdomen appeared normal.  Good hemostasis was noted.  The patient's abdomen was  ?deflated and each incision was closed with interrupted 4-0 Vicryl suture.  Sterile dressing applied.  Sponge stick was removed.  There were no complications. ? ?ESTIMATED BLOOD LOSS:  Minimal. ? ?URINE OUTPUT:  300 mL. ? ?INTRAOPERATIVE FLUIDS:  600 mL ? ?The patient did receive 30 mg intravenous Toradol at the end of the procedure and was taken to recovery room in good condition.  ? ? ? ?SUJ ?D: 10/03/2021 11:43:08 am T: 10/04/2021 12:31:00 am  ?JOB: [id number]/ [id number]  ?

## 2021-10-07 NOTE — Anesthesia Postprocedure Evaluation (Signed)
Anesthesia Post Note ? ?Patient: Sarah Summers ? ?Procedure(s) Performed: LAPAROSCOPIC TUBAL LIGATION WITH FALOPE RINGS (Bilateral: Abdomen) ? ?Patient location during evaluation: PACU ?Anesthesia Type: General ?Level of consciousness: awake and alert ?Pain management: pain level controlled ?Vital Signs Assessment: post-procedure vital signs reviewed and stable ?Respiratory status: spontaneous breathing, nonlabored ventilation, respiratory function stable and patient connected to nasal cannula oxygen ?Cardiovascular status: blood pressure returned to baseline and stable ?Postop Assessment: no apparent nausea or vomiting ?Anesthetic complications: no ? ? ?No notable events documented. ? ? ?Last Vitals:  ?Vitals:  ? 10/03/21 1230 10/03/21 1257  ?BP: 111/68 (!) 99/42  ?Pulse:  67  ?Resp:  15  ?Temp:  (!) 36.3 ?C  ?SpO2:  99%  ?  ?Last Pain:  ?Vitals:  ? 10/06/21 1221  ?TempSrc:   ?PainSc: 5   ? ? ?  ?  ?  ?  ?  ?  ? ?Yevette Edwards ? ? ? ? ?

## 2022-06-25 ENCOUNTER — Other Ambulatory Visit: Payer: Self-pay | Admitting: Obstetrics and Gynecology

## 2022-06-25 DIAGNOSIS — Z1231 Encounter for screening mammogram for malignant neoplasm of breast: Secondary | ICD-10-CM

## 2022-07-06 ENCOUNTER — Ambulatory Visit
Admission: RE | Admit: 2022-07-06 | Discharge: 2022-07-06 | Disposition: A | Discharge status: Home / Self Care | Payer: Medicaid Other | Source: Ambulatory Visit | Attending: Obstetrics and Gynecology | Admitting: Obstetrics and Gynecology

## 2022-07-06 DIAGNOSIS — Z1231 Encounter for screening mammogram for malignant neoplasm of breast: Secondary | ICD-10-CM | POA: Insufficient documentation

## 2022-07-08 ENCOUNTER — Other Ambulatory Visit: Payer: Self-pay | Admitting: Obstetrics and Gynecology

## 2022-07-08 DIAGNOSIS — N63 Unspecified lump in unspecified breast: Secondary | ICD-10-CM

## 2022-07-08 DIAGNOSIS — R928 Other abnormal and inconclusive findings on diagnostic imaging of breast: Secondary | ICD-10-CM

## 2022-07-09 ENCOUNTER — Ambulatory Visit
Admission: RE | Admit: 2022-07-09 | Discharge: 2022-07-09 | Disposition: A | Discharge status: Home / Self Care | Payer: Medicaid Other | Source: Ambulatory Visit | Attending: Obstetrics and Gynecology | Admitting: Obstetrics and Gynecology

## 2022-07-09 DIAGNOSIS — N63 Unspecified lump in unspecified breast: Secondary | ICD-10-CM | POA: Diagnosis present

## 2022-07-09 DIAGNOSIS — R928 Other abnormal and inconclusive findings on diagnostic imaging of breast: Secondary | ICD-10-CM

## 2022-07-15 ENCOUNTER — Other Ambulatory Visit: Payer: Self-pay | Admitting: Obstetrics and Gynecology

## 2022-07-15 DIAGNOSIS — R928 Other abnormal and inconclusive findings on diagnostic imaging of breast: Secondary | ICD-10-CM

## 2022-07-15 DIAGNOSIS — N63 Unspecified lump in unspecified breast: Secondary | ICD-10-CM

## 2022-07-15 HISTORY — PX: BREAST BIOPSY: SHX20

## 2022-07-16 ENCOUNTER — Encounter: Payer: Self-pay | Admitting: Radiology

## 2022-07-16 ENCOUNTER — Ambulatory Visit
Admission: RE | Admit: 2022-07-16 | Discharge: 2022-07-16 | Disposition: A | Discharge status: Home / Self Care | Payer: Medicaid Other | Source: Ambulatory Visit | Attending: Obstetrics and Gynecology | Admitting: Obstetrics and Gynecology

## 2022-07-16 DIAGNOSIS — R928 Other abnormal and inconclusive findings on diagnostic imaging of breast: Secondary | ICD-10-CM

## 2022-07-16 DIAGNOSIS — N63 Unspecified lump in unspecified breast: Secondary | ICD-10-CM | POA: Insufficient documentation

## 2022-07-16 HISTORY — PX: BREAST BIOPSY: SHX20

## 2022-07-16 MED ORDER — LIDOCAINE-EPINEPHRINE 1 %-1:100000 IJ SOLN
8.0000 mL | Freq: Once | INTRAMUSCULAR | Status: AC
  Administered 2022-07-16: 8 mL

## 2022-07-16 MED ORDER — LIDOCAINE HCL (PF) 1 % IJ SOLN
3.0000 mL | Freq: Once | INTRAMUSCULAR | Status: AC
  Administered 2022-07-16: 3 mL

## 2022-07-17 LAB — SURGICAL PATHOLOGY

## 2023-04-26 ENCOUNTER — Encounter: Payer: Self-pay | Admitting: Obstetrics and Gynecology

## 2023-04-28 ENCOUNTER — Ambulatory Visit
Admission: EM | Admit: 2023-04-28 | Discharge: 2023-04-28 | Disposition: A | Discharge status: Home / Self Care | Payer: Medicaid Other

## 2023-04-28 DIAGNOSIS — J0140 Acute pansinusitis, unspecified: Secondary | ICD-10-CM | POA: Diagnosis not present

## 2023-04-28 MED ORDER — AMOXICILLIN-POT CLAVULANATE 875-125 MG PO TABS: 1.0000 | ORAL_TABLET | Freq: Two times a day (BID) | ORAL | 0 refills | Status: AC

## 2023-04-28 MED ORDER — IPRATROPIUM BROMIDE 0.06 % NA SOLN: 2.0000 | Freq: Four times a day (QID) | NASAL | 12 refills | Status: AC

## 2023-04-28 MED ORDER — PROMETHAZINE-DM 6.25-15 MG/5ML PO SYRP: 5.0000 mL | ORAL_SOLUTION | Freq: Four times a day (QID) | ORAL | 0 refills | Status: AC | PRN

## 2023-04-28 MED ORDER — BENZONATATE 100 MG PO CAPS: 200.0000 mg | ORAL_CAPSULE | Freq: Three times a day (TID) | ORAL | 0 refills | Status: AC

## 2023-04-28 NOTE — ED Provider Notes (Signed)
[facility]    CSN: [id number] Arrival date & time: 04/28/23  0901      History   Chief Complaint Chief Complaint  Patient presents with   Fever   Generalized Body Aches   Headache    HPI Sarah Summers is a 42 y.o. female.   HPI  42 year old female with a past medical history significant for hypothyroidism, GERD, depression, and anxiety presents for evaluation of 2 months worth of respiratory symptoms that include sinus pain and pressure with thick nasal discharge, popping in her ears, intermittent sore throat, body aches, chills, fever, and nausea.  Last fever was last night and measured 101.  She does state that her cough is productive but denies shortness of breath or wheezing.  Past Medical History:  Diagnosis Date   Anxiety    Bell palsy 2008   Depression    Family history of adverse reaction to anesthesia    mother has N/V   GERD (gastroesophageal reflux disease)    History of abnormal cervical Pap smear 2001   History of bunionectomy    Hyperthyroidism 2008    There are no problems to display for this patient.   Past Surgical History:  Procedure Laterality Date   BREAST BIOPSY Left 07/15/2022   u/s bx, 10 o'clock RIBBON clip-path pending   BREAST BIOPSY Left 07/16/2022   [place] LT BREAST BX W LOC DEV 1ST LESION IMG BX SPEC US GUIDE 07/16/2022 [facility]   BUNIONECTOMY Right 2006   DILATION AND CURETTAGE OF UTERUS  2008   DILATION AND EVACUATION N/A 08/07/2021   Procedure: SUCTION D&C;  Surgeon: Suzy Bouchard, MD;  Location: [facility];  Service: Gynecology;  Laterality: N/A;   LAPAROSCOPIC TUBAL LIGATION Bilateral 10/03/2021   Procedure: LAPAROSCOPIC TUBAL LIGATION WITH FALOPE RINGS;  Surgeon: Schermerhorn, Ihor Austin, MD;  Location: [facility];  Service: Gynecology;  Laterality: Bilateral;   TONSILLECTOMY AND ADENOIDECTOMY  2003    OB History   No obstetric history on file.      Home Medications    Prior to Admission  medications   Medication Sig Start Date End Date Taking? Authorizing Provider  amoxicillin-clavulanate (AUGMENTIN) 875-125 MG tablet Take 1 tablet by mouth every 12 (twelve) hours for 10 days. 04/28/23 05/08/23 Yes Becky Augusta, NP  benzonatate (TESSALON) 100 MG capsule Take 2 capsules (200 mg total) by mouth every 8 (eight) hours. 04/28/23  Yes Becky Augusta, NP  buPROPion (WELLBUTRIN XL) 150 MG 24 hr tablet Take 1 tablet by mouth every morning. 03/30/23  Yes [provider]  ipratropium (ATROVENT) 0.06 % nasal spray Place 2 sprays into both nostrils 4 (four) times daily. 04/28/23  Yes Becky Augusta, NP  Multiple Vitamins-Minerals (MULTIVITAMIN WITH MINERALS) tablet Take 1 tablet by mouth daily. gummy   Yes [provider]  omeprazole (PRILOSEC) 20 MG capsule Take 20 mg by mouth daily with breakfast.   Yes [provider]  promethazine-dextromethorphan (PROMETHAZINE-DM) 6.25-15 MG/5ML syrup Take 5 mLs by mouth 4 (four) times daily as needed. 04/28/23  Yes Becky Augusta, NP  sertraline (ZOLOFT) 100 MG tablet Take 100 mg by mouth in the morning.   Yes [provider]  varenicline (CHANTIX) 1 MG tablet Take 1 mg by mouth 2 (two) times daily.   Yes [provider]  docusate sodium (COLACE) 100 MG capsule Take 200 mg by mouth daily.    [provider]  ibuprofen (ADVIL) 800 MG tablet Take 800 mg by mouth every 8 (eight)  hours as needed for pain. Patient not taking: Reported on 09/24/2021 09/08/21   [provider]  promethazine (PHENERGAN) 25 MG tablet Take 25 mg by mouth every 6 (six) hours as needed for nausea. Patient not taking: Reported on 09/24/2021 06/30/21   [provider]  traZODone (DESYREL) 100 MG tablet Take 200 mg by mouth at bedtime. 08/29/21   [provider]    Family History Family History  Problem Relation Age of Onset   Breast cancer Neg Hx     Social History Social History   Tobacco Use   Smoking status:  Former    Current packs/day: 0.00    Types: Cigarettes    Quit date: 03/2021    Years since quitting: 2.1   Smokeless tobacco: Never  Vaping Use   Vaping status: Never Used  Substance Use Topics   Alcohol use: Yes    Comment: occasionally   Drug use: Not Currently     Allergies   Patient has no known allergies.   Review of Systems Review of Systems  Constitutional:  Positive for chills and fever.  HENT:  Positive for congestion, sinus pressure, sinus pain and sore throat. Negative for ear pain.   Respiratory:  Positive for cough. Negative for shortness of breath and wheezing.   Gastrointestinal:  Positive for nausea.  Musculoskeletal:  Positive for arthralgias and myalgias.     Physical Exam Triage Vital Signs ED Triage Vitals  Encounter Vitals Group     BP      Systolic BP Percentile      Diastolic BP Percentile      Pulse      Resp      Temp      Temp src      SpO2      Weight      Height      Head Circumference      Peak Flow      Pain Score      Pain Loc      Pain Education      Exclude from Growth Chart    No data found.  Updated Vital Signs BP 112/68 (BP Location: Right Arm)   Pulse 89   Temp 98.9 F (37.2 C) (Oral)   Resp 18   LMP 07/02/2022   SpO2 95%   Visual Acuity Right Eye Distance:   Left Eye Distance:   Bilateral Distance:    Right Eye Near:   Left Eye Near:    Bilateral Near:     Physical Exam Vitals and nursing note reviewed.  Constitutional:      Appearance: Normal appearance. She is not ill-appearing.  HENT:     Head: Normocephalic and atraumatic.     Right Ear: Tympanic membrane, ear canal and external ear normal. There is no impacted cerumen.     Left Ear: Tympanic membrane, ear canal and external ear normal. There is no impacted cerumen.     Nose: Congestion and rhinorrhea present.     Comments: Nasal mucosa is erythematous and edematous with yellow discharge in both nares.  Patient has marked tenderness to  compression of frontal and maxillary sinuses bilaterally.    Mouth/Throat:     Mouth: Mucous membranes are moist.     Pharynx: Oropharynx is clear. Posterior oropharyngeal erythema present. No oropharyngeal exudate.     Comments: Posterior pharynx demonstrates erythema and injection.  No exudate appreciated. Cardiovascular:     Rate and Rhythm: Normal rate and regular  rhythm.     Pulses: Normal pulses.     Heart sounds: Normal heart sounds. No murmur heard.    No friction rub. No gallop.  Pulmonary:     Effort: Pulmonary effort is normal.     Breath sounds: Normal breath sounds. No wheezing, rhonchi or rales.  Musculoskeletal:     Cervical back: Normal range of motion and neck supple.  Lymphadenopathy:     Cervical: No cervical adenopathy.  Skin:    General: Skin is warm and dry.     Capillary Refill: Capillary refill takes less than 2 seconds.     Findings: No rash.  Neurological:     General: No focal deficit present.     Mental Status: She is alert and oriented to person, place, and time.      UC Treatments / Results  Labs (all labs ordered are listed, but only abnormal results are displayed) Labs Reviewed - No data to display  EKG   Radiology No results found.  Procedures Procedures (including critical care time)  Medications Ordered in UC Medications - No data to display  Initial Impression / Assessment and Plan / UC Course  I have reviewed the triage vital signs and the nursing notes.  Pertinent labs & imaging results that were available during my care of the patient were reviewed by me and considered in my medical decision making (see chart for details).   Patient is a pleasant, nontoxic-appearing 42 year old female presenting for evaluation of 2 months worth of respiratory symptoms as outlined HPI above.  Her physical exam does reveal edema of her nasal mucosa with yellow nasal discharge and also tenderness to compression of frontal and maxillary sinuses.   Her cardiopulmonary exam is benign.  I suspect that her cough is being driven by postnasal drip.  I will discharge her home with a diagnosis of pansinusitis and started on Augmentin 875 twice daily for 10 days.  We also discussed sinus irrigation and the use of Atrovent nasal spray to help with the congestion.  Tessalon Perles and Promethazine DM cough syrup for cough and congestion.  Return precautions reviewed.  She denies need for work note.   Final Clinical Impressions(s) / UC Diagnoses   Final diagnoses:  Acute non-recurrent pansinusitis     Discharge Instructions      The Augmentin twice daily with food for 10 days for treatment of your sinusitis.  Perform sinus irrigation 2-3 times a day with a [employer] sinus rinse kit and distilled water.  Do not use tap water.  You can use plain over-the-counter Mucinex every 6 hours to break up the stickiness of the mucus so your body can clear it.  Increase your oral fluid intake to thin out your mucus so that is also able for your body to clear more easily.  Take an over-the-counter probiotic, such as Culturelle-align-activia, 1 hour after each dose of antibiotic to prevent diarrhea.  Use the Atrovent nasal spray, 2 squirts in each nostril every 6 hours, as needed for runny nose and postnasal drip.  Use the Tessalon Perles every 8 hours during the day.  Take them with a small sip of water.  They may give you some numbness to the base of your tongue or a metallic taste in your mouth, this is normal.  Use the Promethazine DM cough syrup at bedtime for cough and congestion.  It will make you drowsy so do not take it during the day.  Return for reevaluation or see your  primary care provider for any new or worsening symptoms.      ED Prescriptions     Medication Sig Dispense Auth. Provider   amoxicillin-clavulanate (AUGMENTIN) 875-125 MG tablet Take 1 tablet by mouth every 12 (twelve) hours for 10 days. 20 tablet Becky Augusta, NP    benzonatate (TESSALON) 100 MG capsule Take 2 capsules (200 mg total) by mouth every 8 (eight) hours. 21 capsule Becky Augusta, NP   ipratropium (ATROVENT) 0.06 % nasal spray Place 2 sprays into both nostrils 4 (four) times daily. 15 mL Becky Augusta, NP   promethazine-dextromethorphan (PROMETHAZINE-DM) 6.25-15 MG/5ML syrup Take 5 mLs by mouth 4 (four) times daily as needed. 118 mL Becky Augusta, NP      PDMP not reviewed this encounter.   Becky Augusta, NP 04/28/23 [phone]

## 2023-04-28 NOTE — ED Triage Notes (Addendum)
Sx started 2 months. She's been treating herself with allergy meds.   Sx are chest and nasal congestion. Facial pain and swelling. Sore throat some times-fever chills body aches.   Patient was told her primary her primary that it was almost over and to continue with allergy meds. Patient states that its not getting better.

## 2023-04-28 NOTE — Discharge Instructions (Addendum)
The Augmentin twice daily with food for 10 days for treatment of your sinusitis.  Perform sinus irrigation 2-3 times a day with a [employer] sinus rinse kit and distilled water.  Do not use tap water.  You can use plain over-the-counter Mucinex every 6 hours to break up the stickiness of the mucus so your body can clear it.  Increase your oral fluid intake to thin out your mucus so that is also able for your body to clear more easily.  Take an over-the-counter probiotic, such as Culturelle-align-activia, 1 hour after each dose of antibiotic to prevent diarrhea.  Use the Atrovent nasal spray, 2 squirts in each nostril every 6 hours, as needed for runny nose and postnasal drip.  Use the Tessalon Perles every 8 hours during the day.  Take them with a small sip of water.  They may give you some numbness to the base of your tongue or a metallic taste in your mouth, this is normal.  Use the Promethazine DM cough syrup at bedtime for cough and congestion.  It will make you drowsy so do not take it during the day.  Return for reevaluation or see your primary care provider for any new or worsening symptoms.

## 2023-07-16 ENCOUNTER — Other Ambulatory Visit: Payer: Self-pay | Admitting: Obstetrics and Gynecology

## 2023-07-16 DIAGNOSIS — Z1231 Encounter for screening mammogram for malignant neoplasm of breast: Secondary | ICD-10-CM

## 2023-07-27 ENCOUNTER — Ambulatory Visit
Admission: RE | Admit: 2023-07-27 | Discharge: 2023-07-27 | Disposition: A | Discharge status: Home / Self Care | Payer: Medicaid Other | Source: Ambulatory Visit | Attending: Obstetrics and Gynecology | Admitting: Obstetrics and Gynecology

## 2023-07-27 DIAGNOSIS — Z1231 Encounter for screening mammogram for malignant neoplasm of breast: Secondary | ICD-10-CM | POA: Diagnosis present

## 2023-11-10 ENCOUNTER — Ambulatory Visit

## 2023-11-10 DIAGNOSIS — Z83719 Family history of colon polyps, unspecified: Secondary | ICD-10-CM | POA: Diagnosis not present

## 2023-11-10 DIAGNOSIS — K6389 Other specified diseases of intestine: Secondary | ICD-10-CM

## 2023-11-10 DIAGNOSIS — D124 Benign neoplasm of descending colon: Secondary | ICD-10-CM | POA: Diagnosis not present

## 2023-11-10 DIAGNOSIS — D123 Benign neoplasm of transverse colon: Secondary | ICD-10-CM | POA: Diagnosis not present

## 2023-11-10 DIAGNOSIS — D122 Benign neoplasm of ascending colon: Secondary | ICD-10-CM | POA: Diagnosis not present

## 2023-11-10 DIAGNOSIS — Z1211 Encounter for screening for malignant neoplasm of colon: Secondary | ICD-10-CM | POA: Diagnosis not present

## 2023-11-10 DIAGNOSIS — K5730 Diverticulosis of large intestine without perforation or abscess without bleeding: Secondary | ICD-10-CM

## 2024-07-07 ENCOUNTER — Other Ambulatory Visit: Payer: Self-pay | Admitting: Obstetrics and Gynecology

## 2024-07-07 DIAGNOSIS — Z1231 Encounter for screening mammogram for malignant neoplasm of breast: Secondary | ICD-10-CM

## 2024-08-03 ENCOUNTER — Ambulatory Visit
